# Patient Record
Sex: Female | Born: 1980 | Race: Asian | Hispanic: No | Marital: Married | Smoking: Never smoker
Health system: Southern US, Community
[De-identification: ages and names within clinical notes are randomized; demographics above are authoritative.]

## PROBLEM LIST (undated history)

## (undated) DIAGNOSIS — N200 Calculus of kidney: Secondary | ICD-10-CM

## (undated) DIAGNOSIS — K269 Duodenal ulcer, unspecified as acute or chronic, without hemorrhage or perforation: Secondary | ICD-10-CM

## (undated) DIAGNOSIS — K219 Gastro-esophageal reflux disease without esophagitis: Secondary | ICD-10-CM

## (undated) HISTORY — DX: Duodenal ulcer, unspecified as acute or chronic, without hemorrhage or perforation: K26.9

## (undated) HISTORY — DX: Gastro-esophageal reflux disease without esophagitis: K21.9

## (undated) HISTORY — DX: Calculus of kidney: N20.0

---

## 2000-04-19 HISTORY — PX: OTHER SURGICAL HISTORY: SHX169

## 2006-06-18 DIAGNOSIS — K269 Duodenal ulcer, unspecified as acute or chronic, without hemorrhage or perforation: Secondary | ICD-10-CM

## 2006-06-18 HISTORY — DX: Duodenal ulcer, unspecified as acute or chronic, without hemorrhage or perforation: K26.9

## 2017-04-19 DIAGNOSIS — N200 Calculus of kidney: Secondary | ICD-10-CM

## 2017-04-19 HISTORY — DX: Calculus of kidney: N20.0

## 2017-05-08 ENCOUNTER — Other Ambulatory Visit: Payer: Self-pay

## 2017-05-08 ENCOUNTER — Emergency Department (HOSPITAL_COMMUNITY)
Admission: EM | Admit: 2017-05-08 | Discharge: 2017-05-09 | Disposition: A | Discharge status: Home / Self Care | Payer: Managed Care, Other (non HMO) | Attending: Emergency Medicine | Admitting: Emergency Medicine

## 2017-05-08 DIAGNOSIS — E876 Hypokalemia: Secondary | ICD-10-CM | POA: Insufficient documentation

## 2017-05-08 DIAGNOSIS — N134 Hydroureter: Secondary | ICD-10-CM | POA: Insufficient documentation

## 2017-05-08 DIAGNOSIS — N200 Calculus of kidney: Secondary | ICD-10-CM

## 2017-05-08 DIAGNOSIS — N201 Calculus of ureter: Secondary | ICD-10-CM | POA: Diagnosis not present

## 2017-05-08 DIAGNOSIS — N130 Hydronephrosis with ureteropelvic junction obstruction: Secondary | ICD-10-CM | POA: Diagnosis not present

## 2017-05-08 DIAGNOSIS — R112 Nausea with vomiting, unspecified: Secondary | ICD-10-CM | POA: Diagnosis not present

## 2017-05-08 DIAGNOSIS — R1032 Left lower quadrant pain: Secondary | ICD-10-CM | POA: Diagnosis present

## 2017-05-08 LAB — COMPREHENSIVE METABOLIC PANEL
ALBUMIN: 4.3 g/dL (ref 3.5–5.0)
ALT: 17 U/L (ref 14–54)
ANION GAP: 8 (ref 5–15)
AST: 20 U/L (ref 15–41)
Alkaline Phosphatase: 63 U/L (ref 38–126)
BILIRUBIN TOTAL: 0.5 mg/dL (ref 0.3–1.2)
BUN: 13 mg/dL (ref 6–20)
CO2: 26 mmol/L (ref 22–32)
Calcium: 9 mg/dL (ref 8.9–10.3)
Chloride: 101 mmol/L (ref 101–111)
Creatinine, Ser: 0.67 mg/dL (ref 0.44–1.00)
GFR calc Af Amer: >60 mL/min (ref >60)
GFR calc non Af Amer: >60 mL/min (ref >60)
GLUCOSE: 166 mg/dL — AB (ref 65–99)
POTASSIUM: 3 mmol/L — AB (ref 3.5–5.1)
Sodium: 135 mmol/L (ref 135–145)
TOTAL PROTEIN: 7.3 g/dL (ref 6.5–8.1)

## 2017-05-08 LAB — CBC WITH DIFFERENTIAL/PLATELET
BASOS ABS: 0 10*3/uL (ref 0.0–0.1)
Basophils Relative: 0 %
Eosinophils Absolute: 0.1 10*3/uL (ref 0.0–0.7)
Eosinophils Relative: 1 %
HEMATOCRIT: 39.9 % (ref 36.0–46.0)
Hemoglobin: 13.4 g/dL (ref 12.0–15.0)
LYMPHS PCT: 9 %
Lymphs Abs: 1.3 10*3/uL (ref 0.7–4.0)
MCH: 29.7 pg (ref 26.0–34.0)
MCHC: 33.6 g/dL (ref 30.0–36.0)
MCV: 88.5 fL (ref 78.0–100.0)
MONO ABS: 0.8 10*3/uL (ref 0.1–1.0)
Monocytes Relative: 5 %
Neutro Abs: 13.1 10*3/uL — ABNORMAL HIGH (ref 1.7–7.7)
Neutrophils Relative %: 85 %
Platelets: 206 10*3/uL (ref 150–400)
RBC: 4.51 MIL/uL (ref 3.87–5.11)
RDW: 11.6 % (ref 11.5–15.5)
WBC: 15.3 10*3/uL — AB (ref 4.0–10.5)

## 2017-05-08 LAB — LIPASE, BLOOD: Lipase: 25 U/L (ref 11–51)

## 2017-05-08 LAB — I-STAT BETA HCG BLOOD, ED (MC, WL, AP ONLY): I-stat hCG, quantitative: <5 m[IU]/mL (ref <5)

## 2017-05-08 MED ORDER — ONDANSETRON HCL 4 MG/2ML IJ SOLN
4.0000 mg | Freq: Once | INTRAMUSCULAR | Status: AC
  Administered 2017-05-09: 4 mg via INTRAVENOUS
  Filled 2017-05-08: qty 2

## 2017-05-08 MED ORDER — FENTANYL CITRATE (PF) 100 MCG/2ML IJ SOLN
50.0000 ug | Freq: Once | INTRAMUSCULAR | Status: AC
  Administered 2017-05-09: 50 ug via INTRAVENOUS
  Filled 2017-05-08: qty 2

## 2017-05-08 MED ORDER — SODIUM CHLORIDE 0.9 % IV BOLUS (SEPSIS)
1000.0000 mL | Freq: Once | INTRAVENOUS | Status: AC
  Administered 2017-05-09: 1000 mL via INTRAVENOUS

## 2017-05-08 NOTE — ED Notes (Signed)
Bed: [id number] Expected date:  Expected time:  Means of arrival:  Comments: 37 yo F/vomiting-abd pain

## 2017-05-08 NOTE — ED Triage Notes (Addendum)
Pt arriving from home with abdominal pain present since yesterday. Pt received 4mg  Zofran IV from EMS prior to arrival. Pt has no medical history. Pt reports last meal was yesterday. No other family members presenting with same symptoms.

## 2017-05-08 NOTE — ED Provider Notes (Signed)
[facility]-EMERGENCY DEPT Provider Note   CSN: [id number] Arrival date & time: 05/08/17  2306     History   Chief Complaint Chief Complaint  Patient presents with  . Abdominal Pain    HPI Jamie Ingram is a 37 y.o. female with no past medical history presenting via EMS with progressive onset nonradiating pressure pain to the left lower quadrant over the last 4 hours.  She also points to her left flank with discomfort. She has been experiencing associated nausea and vomited twice since onset.  She also reports that she had a similar episode yesterday but it improved after a couple hours.  She has been taking Congohinese remedy for intestinal symptoms with mild relief. Last bowel movement was at 8 PM tonight and normal.  She is still passing gas.  She denies fever, chills, dysuria, vaginal discharge or bleeding. LMP was 04/28/16, G1P1001 Husband is at bedside helping translating and providing history.  HPI  History reviewed. No pertinent past medical history.  There are no active problems to display for this patient.   OB History    No data available       Home Medications    Prior to Admission medications   Medication Sig Start Date End Date Taking? Authorizing Provider  fexofenadine (ALLEGRA) 180 MG tablet Take 180 mg by mouth daily.   Yes [provider]  ondansetron (ZOFRAN ODT) 4 MG disintegrating tablet Take 1 tablet (4 mg total) by mouth every 8 (eight) hours as needed for nausea or vomiting. 05/09/17   Mathews RobinsonsMitchell, Jessica B, PA-C  oxyCODONE-acetaminophen (PERCOCET/ROXICET) 5-325 MG tablet Take 2 tablets by mouth every 4 (four) hours as needed for severe pain. 05/09/17   Georgiana ShoreMitchell, Jessica B, PA-C    Family History No family history on file.  Social History Social History   Tobacco Use  . Smoking status: Not on file  Substance Use Topics  . Alcohol use: Not on file  . Drug use: Not on file     Allergies   Patient has no known  allergies.   Review of Systems Review of Systems  Constitutional: Negative for chills, diaphoresis and fever.  HENT: Negative for sore throat.   Respiratory: Negative for cough, shortness of breath, wheezing and stridor.   Cardiovascular: Negative for chest pain and palpitations.  Gastrointestinal: Positive for abdominal pain, nausea and vomiting. Negative for abdominal distention, blood in stool and diarrhea.  Genitourinary: Positive for flank pain. Negative for difficulty urinating, dysuria, frequency, hematuria, pelvic pain, urgency, vaginal bleeding and vaginal discharge.  Musculoskeletal: Negative for arthralgias, back pain, myalgias, neck pain and neck stiffness.  Skin: Negative for color change, pallor and rash.  Neurological: Negative for dizziness, light-headedness and headaches.     Physical Exam Updated Vital Signs BP 104/68 (BP Location: Right Arm)   Pulse 74   Temp 98.4 F (36.9 C) (Oral)   Resp 15   Ht 5\' 3"  (1.6 m)   Wt 55.8 kg (123 lb)   LMP  (LMP Unknown) Comment: negative beta HCG 05/08/17  SpO2 98%   BMI 21.79 kg/m   Physical Exam  Constitutional: She appears well-developed and well-nourished.  Non-toxic appearance. She does not appear ill. No distress.  Afebrile, nontoxic-appearing, lying in bed no apparent discomfort.  No acute distress.  HENT:  Head: Normocephalic and atraumatic.  Eyes: Conjunctivae are normal. No scleral icterus.  Neck: Neck supple.  Cardiovascular: Normal rate, regular rhythm and normal heart sounds.  No murmur heard. Pulmonary/Chest: Effort  normal and breath sounds normal. No stridor. No respiratory distress. She has no wheezes. She has no rhonchi. She has no rales.  Abdominal: Soft. Normal appearance and bowel sounds are normal. She exhibits no distension. There is tenderness in the left lower quadrant. There is no rigidity, no rebound, no guarding, no CVA tenderness and no tenderness at McBurney's point.  Musculoskeletal: She  exhibits no edema.  Neurological: She is alert.  Skin: Skin is warm and dry. No rash noted. She is not diaphoretic. No cyanosis or erythema. No pallor.  Psychiatric: She has a normal mood and affect.  Nursing note and vitals reviewed.    ED Treatments / Results  Labs (all labs ordered are listed, but only abnormal results are displayed) Labs Reviewed  COMPREHENSIVE METABOLIC PANEL - Abnormal; Notable for the following components:      Result Value   Potassium 3.0 (*)    Glucose, Bld 166 (*)    All other components within normal limits  CBC WITH DIFFERENTIAL/PLATELET - Abnormal; Notable for the following components:   WBC 15.3 (*)    Neutro Abs 13.1 (*)    All other components within normal limits  URINALYSIS, ROUTINE W REFLEX MICROSCOPIC - Abnormal; Notable for the following components:   APPearance CLOUDY (*)    Hgb urine dipstick SMALL (*)    Ketones, ur 20 (*)    Leukocytes, UA MODERATE (*)    Bacteria, UA RARE (*)    Squamous Epithelial / LPF 0-5 (*)    All other components within normal limits  LIPASE, BLOOD  I-STAT BETA HCG BLOOD, ED ([facility], [facility], [facility] ONLY)    EKG  EKG Interpretation None       Radiology Ct Abdomen Pelvis W Contrast  Result Date: 05/09/2017 CLINICAL DATA:  Abdominal pain and left flank pain. EXAM: CT ABDOMEN AND PELVIS WITH CONTRAST TECHNIQUE: Multidetector CT imaging of the abdomen and pelvis was performed using the standard protocol following bolus administration of intravenous contrast. CONTRAST:  80 mL ISOVUE-300 IOPAMIDOL (ISOVUE-300) INJECTION 61% COMPARISON:  None. FINDINGS: Lower chest: No pulmonary nodules or pleural effusion. No visible pericardial effusion. Hepatobiliary: Normal hepatic contours and density. No visible biliary dilatation. Normal gallbladder. Pancreas: Normal contours without ductal dilatation. No peripancreatic fluid collection. Spleen: Normal. Adrenals/Urinary Tract: --Adrenal glands: Normal. --Right kidney/ureter: No  hydronephrosis or perinephric stranding. No nephrolithiasis. No obstructing ureteral stones. --Left kidney/ureter: There is a 3 x 3 millimeter stone at the left ureterovesical junction causing mild left hydronephrosis and moderate hydroureter with moderate perinephric stranding. No other left nephrolithiasis. --Urinary bladder: Unremarkable. Stomach/Bowel: --Stomach/Duodenum: No hiatal hernia or other gastric abnormality. Normal duodenal course and caliber. --Small bowel: No dilatation or inflammation. --Colon: No focal abnormality. --Appendix: Normal. Vascular/Lymphatic: Normal course and caliber of the major abdominal vessels. No abdominal or pelvic lymphadenopathy. Reproductive: Normal uterus and ovaries. Musculoskeletal. No bony spinal canal stenosis or focal osseous abnormality. Other: None. IMPRESSION: 1. Left-sided obstructive uropathy with 3 x 3 millimeter ureterovesical junction stone causing moderate hydroureter and mild hydronephrosis with moderate perinephric stranding. 2. No other nephrolithiasis. 3. No diverticulosis or diverticulitis. Electronically Signed   By: Deatra Robinson M.D.   On: 05/09/2017 03:12    Procedures Procedures (including critical care time)  Medications Ordered in ED Medications  sodium chloride 0.9 % bolus 1,000 mL (0 mLs Intravenous Stopped 05/09/17 0110)  fentaNYL (SUBLIMAZE) injection 50 mcg (50 mcg Intravenous Given 05/09/17 0001)  ondansetron (ZOFRAN) injection 4 mg (4 mg Intravenous Given 05/09/17 0001)  iopamidol (  ISOVUE-300) 61 % injection 30 mL (30 mLs Oral Contrast Given 05/09/17 0028)  potassium chloride SA (K-DUR,KLOR-CON) CR tablet 40 mEq (40 mEq Oral Given 05/09/17 0213)  fentaNYL (SUBLIMAZE) injection 50 mcg (50 mcg Intravenous Given 05/09/17 0244)  iopamidol (ISOVUE-300) 61 % injection 100 mL (80 mLs Intravenous Contrast Given 05/09/17 0247)  ketorolac (TORADOL) 15 MG/ML injection 15 mg (15 mg Intravenous Given 05/09/17 0435)  ondansetron (ZOFRAN-ODT)  disintegrating tablet 4 mg (4 mg Oral Given 05/09/17 0434)     Initial Impression / Assessment and Plan / ED Course  I have reviewed the triage vital signs and the nursing notes.  Pertinent labs & imaging results that were available during my care of the patient were reviewed by me and considered in my medical decision making (see chart for details).     Patient presenting with left lower quadrant pain and left flank pain reproducible on palpation. We will give IV fluids, analgesia and reassess Labs with hematuria, leukocytosis and hypokalemia Patient was given potassium  CT with evidence of left sided nephrolithiasis 3 mm at UVJ, hydroureter, mild hydronephrosis with perinephritic stranding.  On reassessment, patient reported significant improvement in her pain and started to feel more nauseated.Patient was given Zofran and successful p.o. Challenge.  She is afebrile, nontoxic-appearing, without CVA tenderness.  Normal vital signs and stable.  Patient improved while in the emergency department.  Discharge home with symptomatic relief and close follow-up with urology.  Discussed strict return precautions and advised to return to the emergency department if experiencing any new or worsening symptoms. Instructions were understood and patient agreed with discharge plan.  Final Clinical Impressions(s) / ED Diagnoses   Final diagnoses:  Nephrolithiasis  Hypokalemia    ED Discharge Orders        Ordered    oxyCODONE-acetaminophen (PERCOCET/ROXICET) 5-325 MG tablet  Every 4 hours PRN     05/09/17 0343    ondansetron (ZOFRAN ODT) 4 MG disintegrating tablet  Every 8 hours PRN     05/09/17 0343       Georgiana Shore, PA-C 05/09/17 0834    Georgiana Shore, PA-C 05/09/17 1610    Shaune Pollack, MD 05/09/17 [phone]

## 2017-05-09 ENCOUNTER — Encounter (HOSPITAL_COMMUNITY): Payer: Self-pay

## 2017-05-09 ENCOUNTER — Emergency Department (HOSPITAL_COMMUNITY): Payer: Managed Care, Other (non HMO)

## 2017-05-09 LAB — URINALYSIS, ROUTINE W REFLEX MICROSCOPIC
BILIRUBIN URINE: NEGATIVE
Glucose, UA: NEGATIVE mg/dL
Ketones, ur: 20 mg/dL — AB
Nitrite: NEGATIVE
PROTEIN: NEGATIVE mg/dL
SPECIFIC GRAVITY, URINE: 1.012 (ref 1.005–1.030)
pH: 8 (ref 5.0–8.0)

## 2017-05-09 MED ORDER — KETOROLAC TROMETHAMINE 15 MG/ML IJ SOLN
15.0000 mg | Freq: Once | INTRAMUSCULAR | Status: AC
  Administered 2017-05-09: 15 mg via INTRAVENOUS
  Filled 2017-05-09: qty 1

## 2017-05-09 MED ORDER — OXYCODONE-ACETAMINOPHEN 5-325 MG PO TABS
2.0000 | ORAL_TABLET | ORAL | 0 refills | Status: DC | PRN
Start: 2017-05-09 — End: 2017-06-14

## 2017-05-09 MED ORDER — POTASSIUM CHLORIDE CRYS ER 20 MEQ PO TBCR
40.0000 meq | EXTENDED_RELEASE_TABLET | Freq: Once | ORAL | Status: AC
  Administered 2017-05-09: 40 meq via ORAL
  Filled 2017-05-09: qty 2

## 2017-05-09 MED ORDER — ONDANSETRON 4 MG PO TBDP
4.0000 mg | ORAL_TABLET | Freq: Once | ORAL | Status: AC
  Administered 2017-05-09: 4 mg via ORAL
  Filled 2017-05-09: qty 1

## 2017-05-09 MED ORDER — IOPAMIDOL (ISOVUE-300) INJECTION 61%
INTRAVENOUS | Status: AC
  Administered 2017-05-09: 80 mL via INTRAVENOUS
  Filled 2017-05-09: qty 100

## 2017-05-09 MED ORDER — FENTANYL CITRATE (PF) 100 MCG/2ML IJ SOLN
50.0000 ug | Freq: Once | INTRAMUSCULAR | Status: AC
  Administered 2017-05-09: 50 ug via INTRAVENOUS
  Filled 2017-05-09: qty 2

## 2017-05-09 MED ORDER — IOPAMIDOL (ISOVUE-300) INJECTION 61%
30.0000 mL | Freq: Once | INTRAVENOUS | Status: AC | PRN
  Administered 2017-05-09: 30 mL via ORAL

## 2017-05-09 MED ORDER — IOPAMIDOL (ISOVUE-300) INJECTION 61%
100.0000 mL | Freq: Once | INTRAVENOUS | Status: AC | PRN
  Administered 2017-05-09: 80 mL via INTRAVENOUS

## 2017-05-09 MED ORDER — IOPAMIDOL (ISOVUE-300) INJECTION 61%
INTRAVENOUS | Status: AC
Start: 2017-05-09 — End: 2017-05-09
  Administered 2017-05-09: 30 mL via ORAL
  Filled 2017-05-09: qty 30

## 2017-05-09 MED ORDER — ONDANSETRON 4 MG PO TBDP: 4.0000 mg | ORAL_TABLET | Freq: Three times a day (TID) | ORAL | 0 refills | Status: DC | PRN

## 2017-05-09 NOTE — ED Notes (Signed)
Pt made aware that urine sample is needed.

## 2017-05-09 NOTE — Discharge Instructions (Addendum)
As discussed, make sure that you stay well-hydrated and take pain medicine as needed.  Use the strainer for urine to attempt to catch stone. Follow up with urology by calling the number provided in these instructions.  Return sooner if symptoms worsen, fever, chills, nausea, vomiting or other new concerning symptoms in the meantime.

## 2017-06-14 ENCOUNTER — Telehealth: Payer: Self-pay | Admitting: Physician Assistant

## 2017-06-14 ENCOUNTER — Ambulatory Visit: Payer: Managed Care, Other (non HMO) | Admitting: Physician Assistant

## 2017-06-14 ENCOUNTER — Encounter: Payer: Self-pay | Admitting: Physician Assistant

## 2017-06-14 VITALS — BP 92/60 | HR 77 | Temp 97.9°F | Ht 64.0 in | Wt 121.5 lb

## 2017-06-14 DIAGNOSIS — R519 Headache, unspecified: Secondary | ICD-10-CM

## 2017-06-14 DIAGNOSIS — Z7689 Persons encountering health services in other specified circumstances: Secondary | ICD-10-CM | POA: Diagnosis not present

## 2017-06-14 DIAGNOSIS — R51 Headache: Secondary | ICD-10-CM | POA: Diagnosis not present

## 2017-06-14 DIAGNOSIS — E876 Hypokalemia: Secondary | ICD-10-CM

## 2017-06-14 LAB — BASIC METABOLIC PANEL
BUN: 8 mg/dL (ref 6–23)
CHLORIDE: 103 meq/L (ref 96–112)
CO2: 28 meq/L (ref 19–32)
Calcium: 9.4 mg/dL (ref 8.4–10.5)
Creatinine, Ser: 0.56 mg/dL (ref 0.40–1.20)
GFR: 129.76 mL/min (ref >60.00)
GLUCOSE: 84 mg/dL (ref 70–99)
POTASSIUM: 4.2 meq/L (ref 3.5–5.1)
SODIUM: 137 meq/L (ref 135–145)

## 2017-06-14 NOTE — Patient Instructions (Signed)
It was great to meet you!  We will call you with your lab results.  If you have a headache, please take 500 mg every 4 to 6 hours. If this doesn't help your headache, please let us know.

## 2017-06-14 NOTE — Progress Notes (Signed)
Jamie Ingram is a 37 y.o. female here to Establish Care.  I acted as a Neurosurgeon for [employer], PA-C Corky Mull, LPN  History of Present Illness:   Chief Complaint  Patient presents with  . Establish Care    [employer]    Acute Concerns: Hypokalemia -- patient recently was in the ED on 1/201/19 for kidney stone. She has followed up with urology and stated that they told her everything looks "normal" and that they tested her urine. She is here to establish care and talk about her potassium level as it was 3.0 in January while in the ED. She states that she took a one-time potassium medication in the hospital. Denies cramping in legs. Eats wide variety of foods. Headaches -- she gets occasional headaches. No blurred vision, dizziness, weakness on one side of the body. She doesn't know what she can take for her headaches as she recently moved to the [place] and is trying to conceive. Describes HA as mild. Unable to really describe the frequency or location. Currently asymptomatic.   Depression screen [facility] 2/9 06/14/2017  Decreased Interest 0  Down, Depressed, Hopeless 0  PHQ - 2 Score 0    No flowsheet data found.   Other providers/specialists: [facility] -- requesting records  Past Medical History:  Diagnosis Date  . Duodenal ulcer 06/2006  . Kidney stone 04/2017     Social History   Socioeconomic History  . Marital status: Married    Spouse name: Not on file  . Number of children: Not on file  . Years of education: Not on file  . Highest education level: Not on file  Social Needs  . Financial resource strain: Not on file  . Food insecurity - worry: Not on file  . Food insecurity - inability: Not on file  . Transportation needs - medical: Not on file  . Transportation needs - non-medical: Not on file  Occupational History  . Not on file  Tobacco Use  . Smoking status: Never Smoker  . Smokeless tobacco: Never Used  Substance and Sexual Activity  . Alcohol use:  No    Frequency: Never  . Drug use: No  . Sexual activity: Yes    Birth control/protection: None  Other Topics Concern  . Not on file  Social History Narrative   From [place]. Moved to [place] last August for husband.   1 child.   Does not work.    Past Surgical History:  Procedure Laterality Date  . right inguinal hernia repair Right 2002  . VAGINAL DELIVERY  2013    History reviewed. No pertinent family history.  No Known Allergies   Current Medications:   Current Outpatient Medications:  .  Loratadine (KLS ALLERCLEAR PO), Take 10 mg by mouth daily., Disp: , Rfl:    Review of Systems:   ROS Negative unless otherwise specified per HPI.  Vitals:   Vitals:   06/14/17 0945  BP: 92/60  Pulse: 77  Temp: 97.9 F (36.6 C)  TempSrc: Oral  SpO2: 97%  Weight: 121 lb 8 oz (55.1 kg)  Height: 5\' 4"  (1.626 m)     Body mass index is 20.86 kg/m.  Physical Exam:   Physical Exam  Constitutional: She appears well-developed. She is cooperative.  Non-toxic appearance. She does not have a sickly appearance. She does not appear ill. No distress.  Cardiovascular: Normal rate, regular rhythm, S1 normal, S2 normal, normal heart sounds and normal pulses.  No LE edema  Pulmonary/Chest: Effort  normal and breath sounds normal.  Neurological: She is alert. She has normal strength. No cranial nerve deficit or sensory deficit. GCS eye subscore is 4. GCS verbal subscore is 5. GCS motor subscore is 6.  Skin: Skin is warm, dry and intact.  Psychiatric: She has a normal mood and affect. Her speech is normal and behavior is normal.  Nursing note and vitals reviewed.   Assessment and Plan:    Takeyah was seen today for establish care.  Diagnoses and all orders for this visit:  Hypokalemia Was low in ED in January. Re-check today. Currently not on any supplementation. Currently asymptomatic. Management per lab result. -     Basic metabolic panel  Encounter to establish care Follow-up prn.  Physical to be done after July 2019.  Nonintractable headache, unspecified chronicity pattern, unspecified headache type Currently asymptomatic. Discussed use of Tylenol prn for HA. Follow-up if symptoms worsen or persist despite treatment. She is currently trying to conceive so I did not recommend ibuprofen at this time.   . Reviewed expectations re: course of current medical issues. . Discussed self-management of symptoms. . Outlined signs and symptoms indicating need for more acute intervention. . Patient verbalized understanding and all questions were answered. . See orders for this visit as documented in the electronic medical record. . Patient received an After-Visit Summary.   CMA or LPN served as scribe during this visit. History, Physical, and Plan performed by medical provider. Documentation and orders reviewed and attested to.   Jarold MottoSamantha Sharen Youngren, PA-C

## 2017-06-14 NOTE — Telephone Encounter (Signed)
The patient walked into the office stating she received a call from us but when she tried calling back she couldn't get in touch with anyone. Best phone number is the one on file. She says it okay to leave the results in a voicemail if she doesn't answer.

## 2017-06-15 NOTE — Telephone Encounter (Signed)
See result note.  

## 2017-06-16 ENCOUNTER — Encounter: Payer: Self-pay | Admitting: Physician Assistant

## 2017-06-16 DIAGNOSIS — N201 Calculus of ureter: Secondary | ICD-10-CM | POA: Insufficient documentation

## 2017-06-30 ENCOUNTER — Telehealth: Payer: Self-pay | Admitting: *Deleted

## 2017-06-30 NOTE — Telephone Encounter (Signed)
Left message on voicemail paperwork is ready for pickup.

## 2017-07-01 NOTE — Telephone Encounter (Signed)
Pt picked up paperwork.

## 2018-12-16 ENCOUNTER — Encounter (HOSPITAL_BASED_OUTPATIENT_CLINIC_OR_DEPARTMENT_OTHER): Payer: Self-pay | Admitting: Emergency Medicine

## 2018-12-16 ENCOUNTER — Emergency Department (HOSPITAL_BASED_OUTPATIENT_CLINIC_OR_DEPARTMENT_OTHER): Payer: Managed Care, Other (non HMO)

## 2018-12-16 ENCOUNTER — Emergency Department (HOSPITAL_BASED_OUTPATIENT_CLINIC_OR_DEPARTMENT_OTHER)
Admission: EM | Admit: 2018-12-16 | Discharge: 2018-12-16 | Disposition: A | Discharge status: Home / Self Care | Payer: Managed Care, Other (non HMO) | Attending: Emergency Medicine | Admitting: Emergency Medicine

## 2018-12-16 ENCOUNTER — Other Ambulatory Visit: Payer: Self-pay

## 2018-12-16 DIAGNOSIS — R1013 Epigastric pain: Secondary | ICD-10-CM | POA: Insufficient documentation

## 2018-12-16 LAB — URINALYSIS, MICROSCOPIC (REFLEX)

## 2018-12-16 LAB — COMPREHENSIVE METABOLIC PANEL
ALT: 12 U/L (ref 0–44)
AST: 14 U/L — ABNORMAL LOW (ref 15–41)
Albumin: 4.5 g/dL (ref 3.5–5.0)
Alkaline Phosphatase: 68 U/L (ref 38–126)
Anion gap: 9 (ref 5–15)
BUN: 7 mg/dL (ref 6–20)
CO2: 25 mmol/L (ref 22–32)
Calcium: 8.9 mg/dL (ref 8.9–10.3)
Chloride: 104 mmol/L (ref 98–111)
Creatinine, Ser: 0.46 mg/dL (ref 0.44–1.00)
GFR calc Af Amer: >60 mL/min (ref >60)
GFR calc non Af Amer: >60 mL/min (ref >60)
Glucose, Bld: 95 mg/dL (ref 70–99)
Potassium: 3.3 mmol/L — ABNORMAL LOW (ref 3.5–5.1)
Sodium: 138 mmol/L (ref 135–145)
Total Bilirubin: 0.7 mg/dL (ref 0.3–1.2)
Total Protein: 7.7 g/dL (ref 6.5–8.1)

## 2018-12-16 LAB — CBC
HCT: 42.4 % (ref 36.0–46.0)
Hemoglobin: 13.7 g/dL (ref 12.0–15.0)
MCH: 29.8 pg (ref 26.0–34.0)
MCHC: 32.3 g/dL (ref 30.0–36.0)
MCV: 92.4 fL (ref 80.0–100.0)
Platelets: 231 10*3/uL (ref 150–400)
RBC: 4.59 MIL/uL (ref 3.87–5.11)
RDW: 11.6 % (ref 11.5–15.5)
WBC: 6.6 10*3/uL (ref 4.0–10.5)
nRBC: 0 % (ref 0.0–0.2)

## 2018-12-16 LAB — URINALYSIS, ROUTINE W REFLEX MICROSCOPIC
Bilirubin Urine: NEGATIVE
Glucose, UA: NEGATIVE mg/dL
Ketones, ur: 15 mg/dL — AB
Nitrite: NEGATIVE
Protein, ur: NEGATIVE mg/dL
Specific Gravity, Urine: <1.005 — ABNORMAL LOW (ref 1.005–1.030)
pH: 6 (ref 5.0–8.0)

## 2018-12-16 LAB — LIPASE, BLOOD: Lipase: 23 U/L (ref 11–51)

## 2018-12-16 LAB — PREGNANCY, URINE: Preg Test, Ur: NEGATIVE

## 2018-12-16 MED ORDER — PANTOPRAZOLE SODIUM 20 MG PO TBEC: 20.0000 mg | DELAYED_RELEASE_TABLET | Freq: Every day | ORAL | 0 refills | Status: DC

## 2018-12-16 MED ORDER — SUCRALFATE 1 GM/10ML PO SUSP: 1.0000 g | Freq: Three times a day (TID) | ORAL | 0 refills | Status: DC

## 2018-12-16 MED ORDER — FAMOTIDINE 20 MG PO TABS: 20.0000 mg | ORAL_TABLET | Freq: Two times a day (BID) | ORAL | 0 refills | Status: DC

## 2018-12-16 MED ORDER — SODIUM CHLORIDE 0.9% FLUSH
3.0000 mL | Freq: Once | INTRAVENOUS | Status: DC
  Filled 2018-12-16: qty 3

## 2018-12-16 MED ORDER — ALUM & MAG HYDROXIDE-SIMETH 200-200-20 MG/5ML PO SUSP: 30.0000 mL | Freq: Once | ORAL | Status: DC

## 2018-12-16 NOTE — Discharge Instructions (Addendum)
You have been diagnosed today with Epigastric pain.  At this time there does not appear to be the presence of an emergent medical condition, however there is always the potential for conditions to change. Please read and follow the below instructions.  Please return to the Emergency Department immediately for any new or worsening symptoms. Please be sure to follow up with your Primary Care Provider within one week regarding your visit today; please call their office to schedule an appointment even if you are feeling better for a follow-up visit. Please follow-up with the specialists at [facility] gastroenterology for further evaluation of your epigastric pain.  Call their number on Monday to schedule follow-up appointment. You may take the medications Pepcid, Carafate and Protonix as prescribed to help with acid buildup and gastrointestinal pain.  Please drink plenty of water and get plenty of rest.  Try to avoid eating within 2 hours of going to bed and avoid laying down after eating as this may worsen pain.  Get help right away if: Your pain does not go away as soon as your doctor says it should. You cannot stop throwing up. Your pain is only in areas of your belly, such as the right side or the left lower part of the belly. You have bloody or black poop, or poop that looks like tar. You have very bad pain, cramping, or bloating in your belly. You have signs of not having enough fluid or water in your body (dehydration), such as: Dark pee, very little pee, or no pee. Cracked lips. Dry mouth. Sunken eyes. Sleepiness. Weakness. Have pain in your arms, neck, jaw, teeth, or back. Feel sweaty, dizzy, or light-headed. Have chest pain or shortness of breath. Vomit and your vomit looks like blood or coffee grounds. Faint. Have stool that is bloody or black. Cannot swallow, drink, or eat. Any new/concerning or worsening symptoms  Please read the additional information packets attached to your  discharge summary.  Do not take your medicine if  develop an itchy rash, swelling in your mouth or lips, or difficulty breathing; call 911 and seek immediate emergency medical attention if this occurs. ==================== Below has been translated using Google translate.  Errors may be present.  Interpret with caution.  ??????????????????????????????????????????????????????? Ika wa g?guru hon'yaku o shiy? shite hon'yaku sa rete imasu. Er? ga hassei shite iru kan?sei ga arimasu. Ch?i shite kaishaku shite kudasai. ==================== ????????????????  ?????????????????????????????????????????????????????????????  1.????????????????????????????????????? 2.???????????1??????????????????????????????????????????????????????????????????????????????????? 3.????????????????[place]???????????????????????????????????????????????????? 4.????????????????????????????????????????????????????????????????????????????????2??????????????????????????????????????????????  ?????????????????????? ??????????????????????????????????? ????????????? ??????????????????????????????? ??????????????????????????????????????????? ????????????????????????????? ????????????????????????????????????? o???????????????????????????? o???????? o??? o?????? o??? o??? ????????????????????? ??????????????????????????? ????????????? ?????????????????????????????? ?????? ?????????????????? ???????????????????????????? ????/???????????  ???????????????????????????????  ????????????????????????????????????????????????????????911??????????????????????? [place]?, shinkabuts? to [employer]. Genjitende wa, kinky? no by?j? no sonzai wa mi raremasenga, tsuneni by?j? ga henka suru kan?sei ga arimasu. Ika no shiji o yonde sore ni shitagatte kudasai. 1. Sh?j? ga atarashiku nattari akka shi tari shita baai wa, tadachini ky?ky?-bu ni modotte kudasai. 2. Ky? no h?mon ni tsuite wa, 1-sh?kan inai ni puraimarikeapurobaid? ni  kanarazu for?appu shite kudasai. For?appu h?mon no kibun ga yoku natta to shite mo, yoyaku o suru tame ni karera no ofisu ni denwa shite kudasai. 3. Shinkabuts? no saranaru hy?ka no tame ni, [place] ich?-ka no senmon'i ni for?appu shite kudasai. Getsuy?bi ni tenwabang? ni denwa shite, for?appu no yotei o tatemasu. 4. Shoh? sa reta shoh?-yaku no pepushido, karafate, purotonikusu o fukuy? shite, san no chikuseki to [place]? no itami o yawarageru koto ga dekimasu. Takusan mizu o nonde, [place]. Sh?shin-go 2-jikan inai no shokuji wa sake, shokuji-go ni  yoko ni naru koto wa itami o [place] sa seru kan?sei ga aru tame sakete kudasai. Tsugi no baai wa, sugu ni sap?to o [place]. ? Anata no isha ga s? subekida to itte mo sugu ni anata no itami wa kiemasen. ? Nageru no o yame rarenai. ? Itami wa, hara no migigawa matawa hidarigawa kabu nado no hara no ry?iki ni nomi arimasu. ? Anata wa chimamire no matawa kuroi [place], matawa t?ru no y?nimieru unchi o motte imasu. ? Hij? ni hidoi itami, keiren, matawa fukubub?man-kan ga arimasu. ? Tsugi no y?na,-tai ni j?bun'na suibun matawa mizu ga nai j?tai (dassui-sh?) no ch?k? ga arimasu. O kurai oshikko, hij? ni ch?sana oshikko, [employer]. O hibi no haitta kuchibiru. O k?katsu. O kubonda me. O nemuke. O jakuten. ? Karsten Ro, kubi, ago, ha, Doctor, general practice senaka ni itami ga aru. ? Ase o kai tari, memai ga shi tari, memai ga shi tari shimasu. ? Mune no itami ya ikigire ga aru. ? ?to-mono to anata no ?to-mono wa chi ka k?h? kasu no y? ni miemasu. ? Candie Chroman. ? [place] matawa kuroi ben o motte iru. ? Nomikon dari, non dari, tabe tari suru koto wa dekimasen. ? Atarash?/ kenen matawa akka suru sh?j? taiin samar? ni tenpu sa rete iru tsuika j?h? paketto o o yomi kudasai. Kayumi o tomonau hosshin, kuchi ya kuchibiru no [place], matawa [place]? [place] o okoshita baai wa, kusuri o fukuy? shinaide kudasai. [place] ga hassei shita baai wa, 911 ni denwa shi, kinky? no kinky? iry? shochi o  motomete kudasai.

## 2018-12-16 NOTE — ED Provider Notes (Addendum)
[facility] EMERGENCY DEPARTMENT Provider Note   CSN: [id number] Arrival date & time: 12/16/18  1204     History   Chief Complaint Chief Complaint  Patient presents with  . Abdominal Pain    HPI   Jamie MayotteJapanese translator used throughout this visit. Jamie [place] is a 38 y.o. female with history of duodenal ulcers and kidney stones presents today for upper abdominal pain that has been present for 2 days.  Patient reports intermittent pain of the upper abdomen a burning sensation that is moderate in intensity.  Patient reports that pain lasts several minutes at a time.  She reports that pain is present whenever she lies flat on her back and then improves when she lays on her left side or stands up.  She reports pain last night caused her difficulty sleeping.  She has not attempted any medications for her pain.  She denies radiation of pain.  Patient denies any pain at this time, reports she had some earlier this morning which resolved.  Patient denies fever/chills, nausea/vomiting, diarrhea, chest pain, shortness of breath, cough, headache, fall/injury, extremity pain/swelling, numbness/weakness, tingling, vaginal discharge, dysuria/hematuria, concern for sexually transmitted disease.  Patient reports that she started her menstrual cycle 3 days ago which was consistent with her regular schedule.  She denies any other concerns today.    HPI  Past Medical History:  Diagnosis Date  . Duodenal ulcer 06/2006  . Kidney stone 04/2017    Patient Active Problem List   Diagnosis Date Noted  . Left ureteral stone 06/16/2017    Past Surgical History:  Procedure Laterality Date  . right inguinal hernia repair Right 2002  . VAGINAL DELIVERY  2013     OB History   No obstetric history on file.      Home Medications    Prior to Admission medications   Medication Sig Start Date End Date Taking? Authorizing Provider  famotidine (PEPCID) 20 MG tablet Take 1 tablet (20 mg  total) by mouth 2 (two) times daily. 12/16/18   Harlene SaltsMorelli, Avaneesh Pepitone A, PA-C  Loratadine (KLS ALLERCLEAR PO) Take 10 mg by mouth daily.    [provider]  pantoprazole (PROTONIX) 20 MG tablet Take 1 tablet (20 mg total) by mouth daily. 12/16/18   Harlene SaltsMorelli, Michayla Mcneil A, PA-C  sucralfate (CARAFATE) 1 GM/10ML suspension Take 10 mLs (1 g total) by mouth 4 (four) times daily -  with meals and at bedtime. 12/16/18   Bill SalinasMorelli, Lleyton Byers A, PA-C    Family History No family history on file.  Social History Social History   Tobacco Use  . Smoking status: Never Smoker  . Smokeless tobacco: Never Used  Substance Use Topics  . Alcohol use: No    Frequency: Never  . Drug use: No     Allergies   Patient has no known allergies.   Review of Systems Review of Systems Ten systems are reviewed and are negative for acute change except as noted in the HPI  Physical Exam Updated Vital Signs BP (!) 128/99 (BP Location: Right Arm)   Pulse 76   Temp 98.5 F (36.9 C) (Oral)   Resp 16   Ht 5' 4.17" (1.63 m)   Wt 43 kg   LMP 12/13/2018   SpO2 100%   BMI 16.18 kg/m   Physical Exam Constitutional:      General: She is not in acute distress.    Appearance: Normal appearance. She is well-developed. She is not ill-appearing or diaphoretic.  HENT:  Head: Normocephalic and atraumatic.     Right Ear: External ear normal.     Left Ear: External ear normal.     Nose: Nose normal.  Eyes:     General: Vision grossly intact. Gaze aligned appropriately.     Pupils: Pupils are equal, round, and reactive to light.  Neck:     Musculoskeletal: Normal range of motion.     Trachea: Trachea and phonation normal. No tracheal deviation.  Cardiovascular:     Rate and Rhythm: Normal rate and regular rhythm.     Pulses:          Dorsalis pedis pulses are 2+ on the right side and 2+ on the left side.     Heart sounds: Normal heart sounds.  Pulmonary:     Effort: Pulmonary effort is normal. No respiratory  distress.  Abdominal:     General: There is no distension.     Palpations: Abdomen is soft.     Tenderness: There is no abdominal tenderness. There is no right CVA tenderness, left CVA tenderness, guarding or rebound. Negative signs include Murphy's sign and McBurney's sign.  Genitourinary:    Comments: Examination deferred by patient. Musculoskeletal: Normal range of motion.     Right lower leg: Normal.     Left lower leg: Normal.  Feet:     Right foot:     Protective Sensation: 3 sites tested. 3 sites sensed.     Left foot:     Protective Sensation: 3 sites tested. 3 sites sensed.  Skin:    General: Skin is warm and dry.  Neurological:     Mental Status: She is alert.     GCS: GCS eye subscore is 4. GCS verbal subscore is 5. GCS motor subscore is 6.     Comments: Speech is clear and goal oriented, follows commands Major Cranial nerves without deficit, no facial droop Moves extremities without ataxia, coordination intact  Psychiatric:        Behavior: Behavior normal.      ED Treatments / Results  Labs (all labs ordered are listed, but only abnormal results are displayed) Labs Reviewed  COMPREHENSIVE METABOLIC PANEL - Abnormal; Notable for the following components:      Result Value   Potassium 3.3 (*)    AST 14 (*)    All other components within normal limits  URINALYSIS, ROUTINE W REFLEX MICROSCOPIC - Abnormal; Notable for the following components:   Color, Urine STRAW (*)    Specific Gravity, Urine <1.005 (*)    Hgb urine dipstick MODERATE (*)    Ketones, ur 15 (*)    Leukocytes,Ua SMALL (*)    All other components within normal limits  URINALYSIS, MICROSCOPIC (REFLEX) - Abnormal; Notable for the following components:   Bacteria, UA RARE (*)    All other components within normal limits  LIPASE, BLOOD  CBC  PREGNANCY, URINE    EKG EKG Interpretation  Date/Time:  Saturday December 16 2018 12:29:17 EDT Ventricular Rate:  62 PR Interval:    QRS Duration: 86  QT Interval:  417 QTC Calculation: 424 R Axis:   86 Text Interpretation:  Sinus rhythm Baseline wander in lead(s) II aVF V4 V5 Normal ECG Confirmed by Gwyneth Sprout ([id number]) on 12/16/2018 4:32:53 PM   Radiology US Abdomen Complete  Result Date: 12/16/2018 CLINICAL DATA:  Epigastric burning pain. EXAM: ABDOMEN ULTRASOUND COMPLETE COMPARISON:  CT of the abdomen on 05/09/2017 FINDINGS: Gallbladder: No gallstones or wall thickening visualized. No sonographic Eulah Pont  sign noted by sonographer. Common bile duct: Diameter: 2 mm. Liver: No focal lesion identified. Within normal limits in parenchymal echogenicity. Portal vein is patent on color Doppler imaging with normal direction of blood flow towards the liver. IVC: No abnormality visualized. Pancreas: Visualized portion unremarkable. Spleen: Size and appearance within normal limits. Right Kidney: Length: 10 cm. Echogenicity within normal limits. No mass or hydronephrosis visualized. Left Kidney: Length: 9.6 cm. Echogenicity within normal limits. No mass or hydronephrosis visualized. Abdominal aorta: No aneurysm visualized. Other findings: No ascites. IMPRESSION: Normal abdominal ultrasound. Electronically Signed   By: Irish LackGlenn  Yamagata M.D.   On: 12/16/2018 15:01    Procedures Procedures (including critical care time)  Medications Ordered in ED Medications - No data to display   Initial Impression / Assessment and Plan / ED Course  I have reviewed the triage vital signs and the nursing notes.  Pertinent labs & imaging results that were available during my care of the patient were reviewed by me and considered in my medical decision making (see chart for details).     On initial evaluation patient is overall well-appearing and in no acute distress.  She denies any pain currently.  She does not want any medications in the ED as she is pain-free.  History suspicious for GERD.  Physical examination today reassuring patient with lungs clear to  auscultation bilaterally, heart regular rate and rhythm, abdomen soft nontender without peritoneal signs, neurovascular intact to all 4 extremities without sign of DVT.  Vital signs stable. - Urinalysis with hemoglobin present, 15 ketones, small leukocytes, nitrite negative.  Patient currently on menstrual cycle and is without urinary symptoms do not suspect UTI.  Lipase within normal limits Pregnancy test negative CMP nonacute CBC within normal limits Abdominal ultrasound:  IMPRESSION:  Normal abdominal ultrasound.   EKG: Sinus rhythm Baseline wander in lead(s) II aVF V4 V5 Normal ECG Confirmed by Gwyneth SproutPlunkett, Whitney ([id number]) on 12/16/2018 4:32:53 PM - Patient refused pelvic examination today stating that she has no pelvic symptoms or concern for STD, but this is reasonable at this time she will follow-up with OB/GYN for any pelvic related symptoms. ----- On reassessment patient is overall well-appearing in no acute distress, still denying any pain.  Suspect patient with reflux as etiology of her symptoms today.  Do not suspect perforation, obstruction, infection or other acute abdominal pelvic etiology of patient's symptoms at this time based on history, physical examination, work-up and vital signs.  Will treat patient symptomatically with Pepcid, Protonix, Carafate and GI referral.  EKG was obtained by triage and is reassuring, do not suspect cardiopulmonary etiology of patient's symptoms based on history and work-up.  At this time there does not appear to be any evidence of an acute emergency medical condition and the patient appears stable for discharge with appropriate outpatient follow up. Diagnosis was discussed with patient who verbalizes understanding of care plan and is agreeable to discharge. I have discussed return precautions with patient who verbalizes understanding of return precautions. Patient encouraged to follow-up with their PCP and gastroenterology. All questions answered.   Patient's case discussed with Dr. Anitra LauthPlunkett who agrees with plan to discharge with gastroenterology follow-up.   Jamie MayotteJapanese translator used throughout this visit.  Note: Portions of this report may have been transcribed using voice recognition software. Every effort was made to ensure accuracy; however, inadvertent computerized transcription errors may still be present. Final Clinical Impressions(s) / ED Diagnoses   Final diagnoses:  Epigastric pain    ED Discharge Orders  Ordered    pantoprazole (PROTONIX) 20 MG tablet  Daily     12/16/18 1718    famotidine (PEPCID) 20 MG tablet  2 times daily     12/16/18 1718    sucralfate (CARAFATE) 1 GM/10ML suspension  3 times daily with meals & bedtime     12/16/18 1718           Deliah Boston, PA-C 12/16/18 1730    Deliah Boston, PA-C 12/16/18 1732    Blanchie Dessert, MD 12/16/18 2328

## 2018-12-16 NOTE — ED Notes (Signed)
ED Provider at bedside. 

## 2018-12-16 NOTE — ED Triage Notes (Addendum)
Upper abd pain x 2 days with N/V. Hurts worse lying supine.

## 2018-12-18 ENCOUNTER — Encounter: Payer: Self-pay | Admitting: Gastroenterology

## 2018-12-28 ENCOUNTER — Encounter: Payer: Self-pay | Admitting: Gastroenterology

## 2018-12-28 ENCOUNTER — Other Ambulatory Visit: Payer: Managed Care, Other (non HMO)

## 2018-12-28 ENCOUNTER — Other Ambulatory Visit: Payer: Self-pay

## 2018-12-28 ENCOUNTER — Telehealth: Payer: Self-pay | Admitting: Gastroenterology

## 2018-12-28 ENCOUNTER — Telehealth: Payer: Self-pay | Admitting: General Surgery

## 2018-12-28 ENCOUNTER — Ambulatory Visit: Payer: Managed Care, Other (non HMO) | Admitting: Gastroenterology

## 2018-12-28 VITALS — BP 100/72 | HR 82 | Temp 98.3°F | Ht 64.17 in | Wt 120.0 lb

## 2018-12-28 DIAGNOSIS — R1013 Epigastric pain: Secondary | ICD-10-CM | POA: Diagnosis not present

## 2018-12-28 DIAGNOSIS — Z8719 Personal history of other diseases of the digestive system: Secondary | ICD-10-CM | POA: Diagnosis not present

## 2018-12-28 MED ORDER — PANTOPRAZOLE SODIUM 40 MG PO TBEC: 40.0000 mg | DELAYED_RELEASE_TABLET | Freq: Every day | ORAL | 11 refills | Status: AC

## 2018-12-28 NOTE — Telephone Encounter (Signed)
Contacted the patient and explained that she needed to stop her PPI today and remain off of it for 2 weeks then take her H Pylori stool test. The patient verbalized understanding and repeated back instructions.

## 2018-12-28 NOTE — Progress Notes (Signed)
12/28/2018 Jamie Ingram [id number] [birth year]   HISTORY OF PRESENT ILLNESS: This is a 38 year old female who is new to our office.  She is here today as an ER follow-up of epigastric abdominal pain.  She tells me that when she was 38 years old in [place] she was diagnosed with a duodenal ulcer and H. pylori.  She was treated for the H. pylori.  Recently about 2 months ago had an episode of epigastric abdominal pain.  That resolved and by the next day the pain was gone.  About 2 weeks ago she again developed epigastric abdominal pain.  She went to the emergency room where they did an abdominal ultrasound that was normal.  CBC, CMP, lipase were within normal limits.  She was placed on pantoprazole 20 mg daily, which she has been taking.  She has noticed improvement in her symptoms since that time.  She is also taking Pepcid 20 mg once or twice daily as well.  She also reports occasional sensation that food gets hung up or is slow to go down.  That has just been recent as well.   Past Medical History:  Diagnosis Date  . Duodenal ulcer 06/2006  . Kidney stone 04/2017   Past Surgical History:  Procedure Laterality Date  . right inguinal hernia repair Right 2002  . VAGINAL DELIVERY  2013    reports that she has never smoked. She has never used smokeless tobacco. She reports that she does not drink alcohol or use drugs. family history is not on file. No Known Allergies    Outpatient Encounter Medications as of 12/28/2018  Medication Sig  . famotidine (PEPCID) 20 MG tablet Take 1 tablet (20 mg total) by mouth 2 (two) times daily.  . Loratadine (KLS ALLERCLEAR PO) Take 10 mg by mouth daily.  . pantoprazole (PROTONIX) 20 MG tablet Take 1 tablet (20 mg total) by mouth daily.  . [DISCONTINUED] sucralfate (CARAFATE) 1 GM/10ML suspension Take 10 mLs (1 g total) by mouth 4 (four) times daily -  with meals and at bedtime.   No facility-administered encounter medications on file as of 12/28/2018.      REVIEW OF SYSTEMS  : All other systems reviewed and negative except where noted in the History of Present Illness.   PHYSICAL EXAM: BP 100/72   Pulse 82   Temp 98.3 F (36.8 C)   Ht 5' 4.17" (1.63 m)   Wt 120 lb (54.4 kg)   LMP 12/13/2018   BMI 20.49 kg/m  General: Well developed Asian female in no acute distress Head: Normocephalic and atraumatic Eyes:  Sclerae anicteric, conjunctiva pink. Ears: Normal auditory acuity Lungs: Clear throughout to auscultation; no increased WOB. Heart: Regular rate and rhythm; no increased WOB. Abdomen: Soft, non-distended.  BS present.  Mild epigastric TTP. Musculoskeletal: Symmetrical with no gross deformities  Skin: No lesions on visible extremities Extremities: No edema  Neurological: Alert oriented x 4, grossly non-focal Psychological:  Alert and cooperative. Normal mood and affect  ASSESSMENT AND PLAN: *38 year old [place] female with complaints of epigastric abdominal pain.  Has improved at this point.  Apparently has history of duodenal ulcer and H. pylori that was treated in [place] about 15 years ago.  Is on pantoprazole 20 mg daily.  I have asked her to hold that medication for 14 days and complete H. pylori stool antigen test to confirm that is no longer an issue.  Then she will resume pantoprazole at 40 mg daily.  New prescription sent.  I would then like her to follow-up here in about 6 weeks for reassessment.  If symptoms continue then could consider EGD at that time if needed.   CC:  Jarold MottoWorley, Samantha, [place]

## 2018-12-28 NOTE — Patient Instructions (Addendum)
If you are age 38 or older, your body mass index should be between 23-30. Your Body mass index is 20.49 kg/m. If this is out of the aforementioned range listed, please consider follow up with your Primary Care Provider.  If you are age 68 or younger, your body mass index should be between 19-25. Your Body mass index is 20.49 kg/m. If this is out of the aformentioned range listed, please consider follow up with your Primary Care Provider.   We have sent the following medications to your pharmacy for you to pick up at your convenience:  A new prescription of pantoprazole 40mg  was sent to your pharmacy. Take 1 40 mg tablet daily. DO NOT start this medication until you have completed your stool testing.   Your provider has requested that you go to the basement level for lab work before leaving today. Press "B" on the elevator. The lab is located at the first door on the left as you exit the elevator. You must NOT have had any pantoprazole 2 weeks prior to completing this study.  Thank you for choosing me and [facility]

## 2018-12-29 NOTE — Telephone Encounter (Signed)
Called the patient and she stated she did not call back for instructions, that she remembered speaking with me in regards to stopping her PPI for 2 weeks then she will do her stool sample.

## 2019-01-16 ENCOUNTER — Other Ambulatory Visit: Payer: Managed Care, Other (non HMO)

## 2019-01-16 DIAGNOSIS — R1013 Epigastric pain: Secondary | ICD-10-CM

## 2019-01-16 DIAGNOSIS — Z8719 Personal history of other diseases of the digestive system: Secondary | ICD-10-CM

## 2019-01-17 LAB — HELICOBACTER PYLORI  SPECIAL ANTIGEN
MICRO NUMBER:: 934261
SPECIMEN QUALITY: ADEQUATE

## 2019-01-19 ENCOUNTER — Telehealth: Payer: Self-pay | Admitting: Gastroenterology

## 2019-01-19 NOTE — Telephone Encounter (Signed)
See result note.  

## 2019-02-05 NOTE — Progress Notes (Signed)
Reviewed and agree with documentation and assessment and plan. K. Veena Nandigam , MD   

## 2019-02-07 ENCOUNTER — Other Ambulatory Visit: Payer: Self-pay

## 2019-02-07 ENCOUNTER — Ambulatory Visit: Payer: Managed Care, Other (non HMO) | Admitting: Gastroenterology

## 2019-02-07 ENCOUNTER — Encounter: Payer: Self-pay | Admitting: Gastroenterology

## 2019-02-07 VITALS — BP 106/78 | HR 80 | Temp 98.8°F | Ht 64.0 in | Wt 119.8 lb

## 2019-02-07 DIAGNOSIS — R1013 Epigastric pain: Secondary | ICD-10-CM

## 2019-02-07 NOTE — Progress Notes (Signed)
     02/07/2019 Jamie [place] [id number] 1981-03-18   HISTORY OF PRESENT ILLNESS:  This is a 38 year old female from [place] who is here for follow-up of epigastric abdominal pain and reflux.  Hpylori study here was negative.  Once again, had history of Hpylori and DU on EGD in [place] when she was 44.  Was treated successfully as evidenced by recent negative Hpylori here.  She has been taking her pantoprazole 40 mg daily.  Overall feels better.  She's only had two episodes of epigastric abdominal pain at nighttime since I saw her about 5 weeks ago.  Still concerned, however, because she says that they do EGD's in [place] even when people do not have symptoms.  (I did explain to her that this is probably because of the high incidence of Hpylori and gastric cancer in her country and that her Fransico Setters has been treated and now negative, so her risk is much less).  Nonetheless, she is still concerned.  No other symptoms.   Past Medical History:  Diagnosis Date  . Duodenal ulcer 06/2006  . GERD (gastroesophageal reflux disease)   . Kidney stone 04/2017   Past Surgical History:  Procedure Laterality Date  . right inguinal hernia repair Right 2002  . VAGINAL DELIVERY  2013    reports that she has never smoked. She has never used smokeless tobacco. She reports that she does not drink alcohol or use drugs. family history is not on file. No Known Allergies    Outpatient Encounter Medications as of 02/07/2019  Medication Sig  . pantoprazole (PROTONIX) 40 MG tablet Take 1 tablet (40 mg total) by mouth daily.  . [DISCONTINUED] famotidine (PEPCID) 20 MG tablet Take 1 tablet (20 mg total) by mouth 2 (two) times daily.  . [DISCONTINUED] Loratadine (KLS ALLERCLEAR PO) Take 10 mg by mouth daily.   No facility-administered encounter medications on file as of 02/07/2019.      REVIEW OF SYSTEMS  : All other systems reviewed and negative except where noted in the History of Present Illness.   PHYSICAL  EXAM: BP 106/78   Pulse 80   Temp 98.8 F (37.1 C)   Ht 5\' 4"  (1.626 m)   Wt 119 lb 12.8 oz (54.3 kg)   BMI 20.56 kg/m  General: Well developed [place] female in no acute distress Head: Normocephalic and atraumatic Eyes:  Sclerae anicteric, conjunctiva pink. Ears: Normal auditory acuity Lungs: Clear throughout to auscultation; no increased WOB. Heart: Regular rate and rhythm; no M/R/G. Abdomen: Soft, non-distended.  BS present.  Minimal epigastric TTP. Musculoskeletal: Symmetrical with no gross deformities  Skin: No lesions on visible extremities Extremities: No edema  Neurological: Alert oriented x 4, grossly non-focal Psychological:  Alert and cooperative. Normal mood and affect  ASSESSMENT AND PLAN: *Epigastric pain, GERD:  Hpylori negative.  Symptoms improved, but still with occasional epigastric pain at nighttime.  Still on pantoprazole 40 mg daily.  Will continue for now.  Will plan for EGD with Dr. Silverio Decamp to rule out ulcer, esophagitis, etc.  Also for patient piece of mind.  **The risks, benefits, and alternatives to EGD were discussed with the patient and she consents to proceed.   CC:  Inda Coke, [place]

## 2019-02-07 NOTE — Patient Instructions (Signed)
If you are age 38 or younger, your body mass index should be between 19-25. Your Body mass index is 20.56 kg/m. If this is out of the aformentioned range listed, please consider follow up with your Primary Care Provider.    You have been scheduled for an endoscopy. Please follow written instructions given to you at your visit today. If you use inhalers (even only as needed), please bring them with you on the day of your procedure.  Thank you for choosing me and [facility].  Janett Billow Zehr-PA

## 2019-02-07 NOTE — Progress Notes (Signed)
Reviewed and agree with documentation and assessment and plan. [name] , MD   

## 2019-02-08 ENCOUNTER — Telehealth: Payer: Self-pay

## 2019-02-08 ENCOUNTER — Other Ambulatory Visit: Payer: Self-pay

## 2019-02-08 DIAGNOSIS — Z1159 Encounter for screening for other viral diseases: Secondary | ICD-10-CM

## 2019-02-08 NOTE — Telephone Encounter (Signed)
Pt returned call and answered “No” to all questions.  ° °

## 2019-02-08 NOTE — Telephone Encounter (Signed)
Covid-19 screening questions   Do you now or have you had a fever in the last 14 days?  Do you have any respiratory symptoms of shortness of breath or cough now or in the last 14 days?  Do you have any family members or close contacts with diagnosed or suspected Covid-19 in the past 14 days?  Have you been tested for Covid-19 and found to be positive?       

## 2019-02-09 ENCOUNTER — Ambulatory Visit (AMBULATORY_SURGERY_CENTER): Payer: Managed Care, Other (non HMO) | Admitting: Gastroenterology

## 2019-02-09 ENCOUNTER — Other Ambulatory Visit: Payer: Self-pay

## 2019-02-09 ENCOUNTER — Encounter: Payer: Self-pay | Admitting: Gastroenterology

## 2019-02-09 VITALS — BP 134/96 | HR 65 | Temp 97.8°F | Resp 12 | Ht 64.0 in | Wt 119.0 lb

## 2019-02-09 DIAGNOSIS — K2950 Unspecified chronic gastritis without bleeding: Secondary | ICD-10-CM

## 2019-02-09 DIAGNOSIS — R1013 Epigastric pain: Secondary | ICD-10-CM

## 2019-02-09 MED ORDER — SODIUM CHLORIDE 0.9 % IV SOLN: 500.0000 mL | Freq: Once | INTRAVENOUS | Status: DC

## 2019-02-09 NOTE — Op Note (Signed)
[facility] Patient Name: Jamie [place] Procedure Date: 02/09/2019 1:34 PM MRN: [id number] Endoscopist: Napoleon Form , MD Age: 37 Referring MD:  Date of Birth: 1980-10-02 Gender: Female Account #: [id number] Procedure:                Upper GI endoscopy Indications:              Epigastric abdominal pain Medicines:                Monitored Anesthesia Care Procedure:                Pre-Anesthesia Assessment:                           - Prior to the procedure, a History and Physical                            was performed, and patient medications and                            allergies were reviewed. The patient's tolerance of                            previous anesthesia was also reviewed. The risks                            and benefits of the procedure and the sedation                            options and risks were discussed with the patient.                            All questions were answered, and informed consent                            was obtained. Prior Anticoagulants: The patient has                            taken no previous anticoagulant or antiplatelet                            agents. ASA Grade Assessment: II - A patient with                            mild systemic disease. After reviewing the risks                            and benefits, the patient was deemed in                            satisfactory condition to undergo the procedure.                           After obtaining informed consent, the endoscope was  passed under direct vision. Throughout the                            procedure, the patient's blood pressure, pulse, and                            oxygen saturations were monitored continuously. The                            Endoscope was introduced through the mouth, and                            advanced to the second part of duodenum. The upper                            GI endoscopy was  accomplished without difficulty.                            The patient tolerated the procedure well. Scope In: Scope Out: Findings:                 The esophagus was normal.                           The Z-line was regular and was found 36 cm from the                            incisors.                           Patchy mild inflammation characterized by                            congestion (edema) and erythema was found in the                            entire examined stomach. Biopsies were taken with a                            cold forceps for Helicobacter pylori testing.                           The examined duodenum was normal. Complications:            No immediate complications. Estimated Blood Loss:     Estimated blood loss was minimal. Impression:               - Normal esophagus.                           - Z-line regular, 36 cm from the incisors.                           - Gastritis. Biopsied.                           -  Normal examined duodenum. Recommendation:           - Patient has a contact number available for                            emergencies. The signs and symptoms of potential                            delayed complications were discussed with the                            patient. Return to normal activities tomorrow.                            Written discharge instructions were provided to the                            patient.                           - Resume previous diet.                           - Continue present medications.                           - Await pathology results.                           - Return to GI office in 3 months. Napoleon FormKavitha V. Sweet Jarvis, MD 02/09/2019 1:53:05 PM This report has been signed electronically.

## 2019-02-09 NOTE — Progress Notes (Signed)
Teeth unchanged after procedure. 

## 2019-02-09 NOTE — Progress Notes (Signed)
A and O x3. Report to RN. Tolerated MAC anesthesia well.

## 2019-02-09 NOTE — Progress Notes (Signed)
Called to room to assist during endoscopic procedure.  Patient ID and intended procedure confirmed with present staff. Received instructions for my participation in the procedure from the performing physician.  

## 2019-02-09 NOTE — Patient Instructions (Signed)
Handout on Gastritis given to you today  Await biopsy results from Dr Silverio Decamp   Return to Dr Woodward Ku office in 3 months- make this appointment when convenient    YOU HAD AN ENDOSCOPIC PROCEDURE TODAY AT [facility]:   Refer to the procedure report that was given to you for any specific questions about what was found during the examination.  If the procedure report does not answer your questions, please call your gastroenterologist to clarify.  If you requested that your care partner not be given the details of your procedure findings, then the procedure report has been included in a sealed envelope for you to review at your convenience later.  YOU SHOULD EXPECT: Some feelings of bloating in the abdomen. Passage of more gas than usual.  Walking can help get rid of the air that was put into your GI tract during the procedure and reduce the bloating. If you had a lower endoscopy (such as a colonoscopy or flexible sigmoidoscopy) you may notice spotting of blood in your stool or on the toilet paper. If you underwent a bowel prep for your procedure, you may not have a normal bowel movement for a few days.  Please Note:  You might notice some irritation and congestion in your nose or some drainage.  This is from the oxygen used during your procedure.  There is no need for concern and it should clear up in a day or so.  SYMPTOMS TO REPORT IMMEDIATELY:     Following upper endoscopy (EGD)  Vomiting of blood or coffee ground material  New chest pain or pain under the shoulder blades  Painful or persistently difficult swallowing  New shortness of breath  Fever of 100F or higher  Black, tarry-looking stools  For urgent or emergent issues, a gastroenterologist can be reached at any hour by calling [phone].   DIET:  We do recommend a small meal at first, but then you may proceed to your regular diet.  Drink plenty of fluids but you should avoid alcoholic beverages for  24 hours.  ACTIVITY:  You should plan to take it easy for the rest of today and you should NOT DRIVE or use heavy machinery until tomorrow (because of the sedation medicines used during the test).    FOLLOW UP: Our staff will call the number listed on your records 48-72 hours following your procedure to check on you and address any questions or concerns that you may have regarding the information given to you following your procedure. If we do not reach you, we will leave a message.  We will attempt to reach you two times.  During this call, we will ask if you have developed any symptoms of COVID 19. If you develop any symptoms (ie: fever, flu-like symptoms, shortness of breath, cough etc.) before then, please call [phone].  If you test positive for Covid 19 in the 2 weeks post procedure, please call and report this information to [place].    If any biopsies were taken you will be contacted by phone or by letter within the next 1-3 weeks.  Please call us at [phone] if you have not heard about the biopsies in 3 weeks.    SIGNATURES/CONFIDENTIALITY: You and/or your care partner have signed paperwork which will be entered into your electronic medical record.  These signatures attest to the fact that that the information above on your After Visit Summary has been reviewed and is understood.  Full responsibility of  the confidentiality of this discharge information lies with you and/or your care-partner. 

## 2019-02-09 NOTE — Progress Notes (Signed)
Temp taken by [facility] VS taken by CW 

## 2019-02-13 ENCOUNTER — Telehealth: Payer: Self-pay

## 2019-02-13 NOTE — Telephone Encounter (Signed)
  Follow up Call-  Call back number 02/09/2019  Post procedure Call Back phone  # [phone]  Permission to leave phone message Yes     Patient questions:  Do you have a fever, pain , or abdominal swelling? No. Pain Score  0 *  Have you tolerated food without any problems? Yes.    Have you been able to return to your normal activities? Yes.    Do you have any questions about your discharge instructions: Diet   No. Medications  No. Follow up visit  No.  Do you have questions or concerns about your Care? No.  Actions: * If pain score is 4 or above: No action needed, pain <4.  1. Have you developed a fever since your procedure? no  2.   Have you had an respiratory symptoms (SOB or cough) since your procedure? no  3.   Have you tested positive for COVID 19 since your procedure no  4.   Have you had any family members/close contacts diagnosed with the COVID 19 since your procedure?  no   If yes to any of these questions please route to Joylene John, RN and Alphonsa Gin, Therapist, sports.

## 2019-02-14 ENCOUNTER — Encounter: Payer: Self-pay | Admitting: Gastroenterology

## 2019-02-20 ENCOUNTER — Encounter: Payer: Self-pay | Admitting: Gastroenterology

## 2019-03-05 ENCOUNTER — Other Ambulatory Visit: Payer: Self-pay

## 2019-03-05 DIAGNOSIS — Z20822 Contact with and (suspected) exposure to covid-19: Secondary | ICD-10-CM

## 2019-03-07 LAB — NOVEL CORONAVIRUS, NAA: SARS-CoV-2, NAA: NOT DETECTED

## 2019-03-21 ENCOUNTER — Other Ambulatory Visit: Payer: Self-pay

## 2019-03-21 DIAGNOSIS — Z20822 Contact with and (suspected) exposure to covid-19: Secondary | ICD-10-CM

## 2019-03-23 LAB — NOVEL CORONAVIRUS, NAA: SARS-CoV-2, NAA: NOT DETECTED

## 2019-09-26 IMAGING — CT CT ABD-PELV W/ CM
2 of 4 series · 15 of 46 positions shown, 17 images · IV contrast (ISOVUE)
Comparison: None.

CLINICAL DATA: Abdominal pain and left flank pain.

EXAM:
CT ABDOMEN AND PELVIS WITH CONTRAST
TECHNIQUE: Multidetector CT imaging of the abdomen and pelvis was performed
using the standard protocol following bolus administration of
intravenous contrast.
CONTRAST:  80 mL [id number] IOPAMIDOL ([id number]) INJECTION 61%

[Series 2: axial st · axial · 0.66mm/px · z∈[+1113,+1543]mm · 12 of 96 slices shown, 14 images]
[im 5/96  soft-tissue]
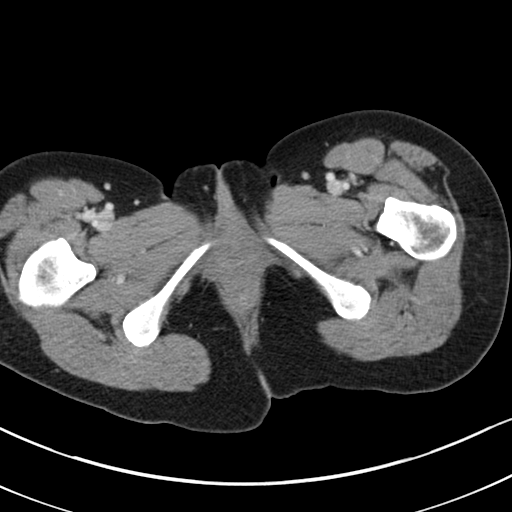
[im 5/96  bone]
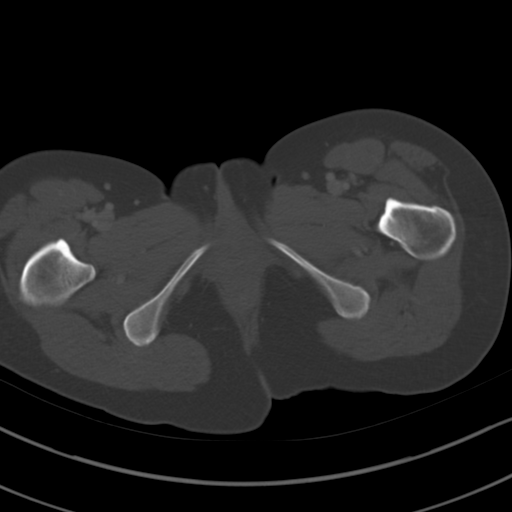
[im 14/96  soft-tissue]
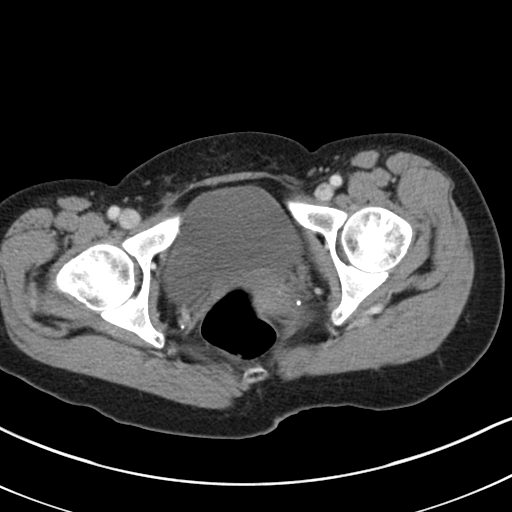
[im 23/96  soft-tissue]
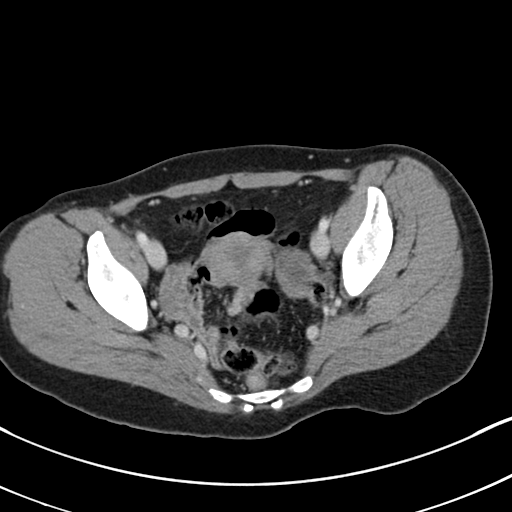
[im 28/96  soft-tissue]
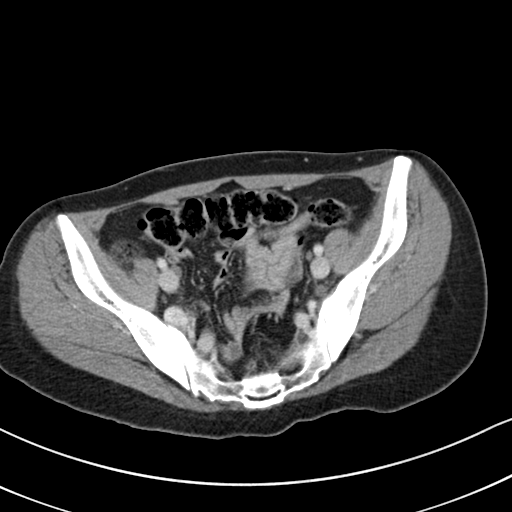
[im 37/96  soft-tissue]
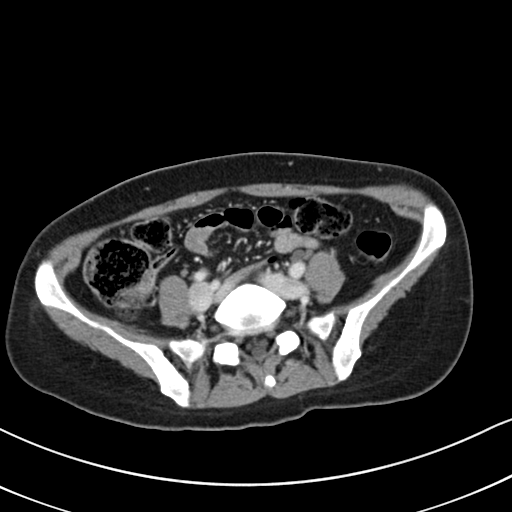
[im 46/96  soft-tissue]
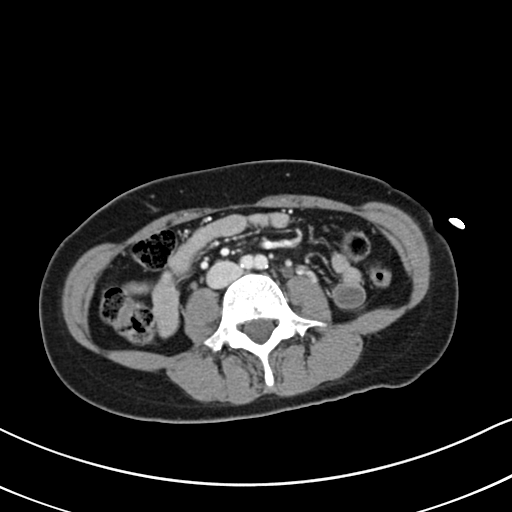
[im 50/96  soft-tissue]
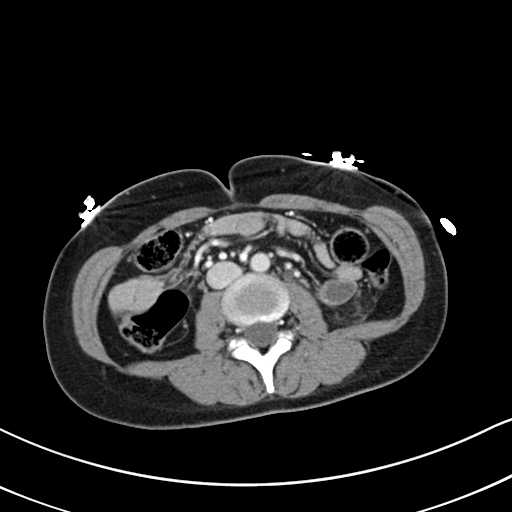
[im 59/96  soft-tissue]
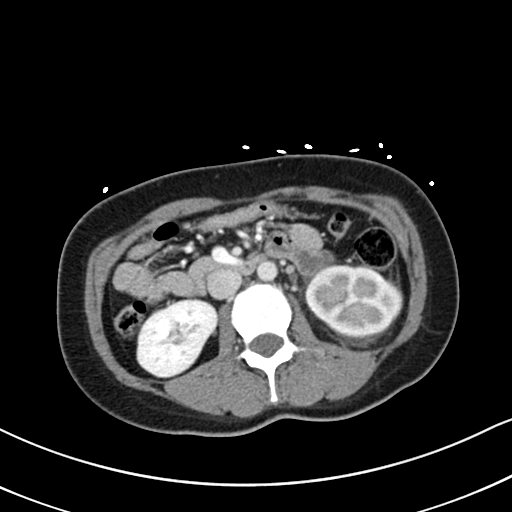
[im 68/96  soft-tissue]
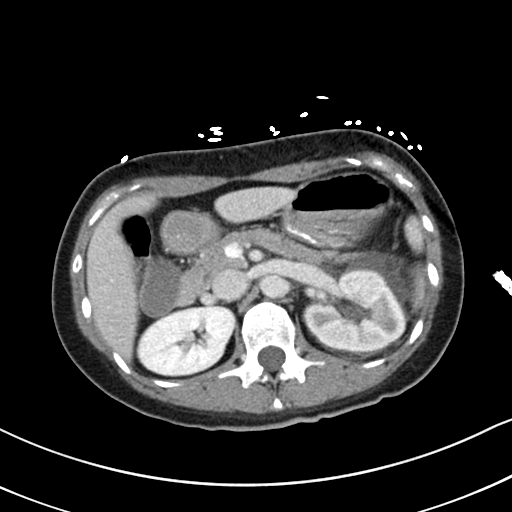
[im 68/96  bone]
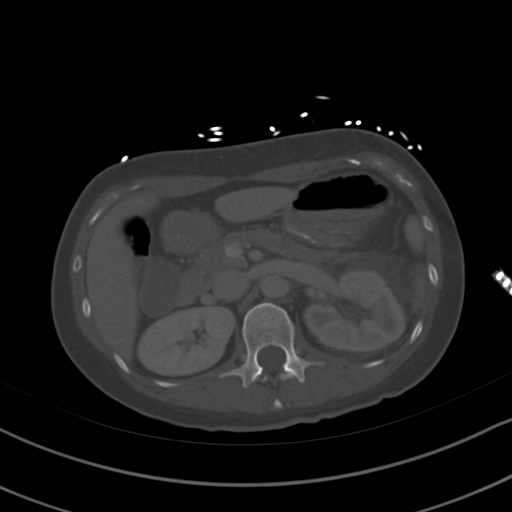
[im 73/96  soft-tissue]
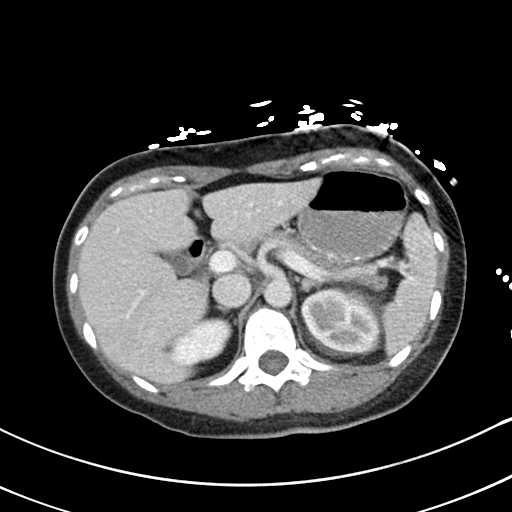
[im 82/96  soft-tissue]
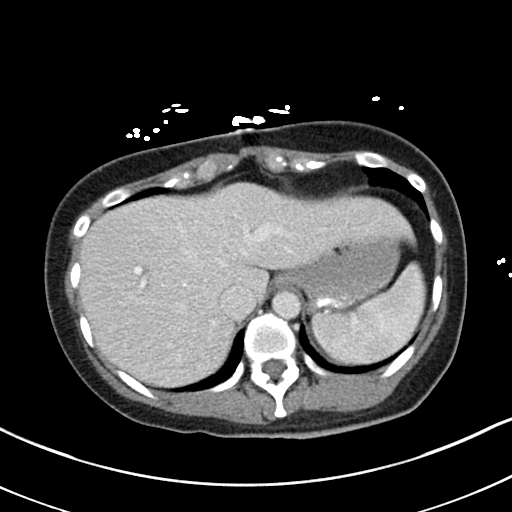
[im 91/96  soft-tissue]
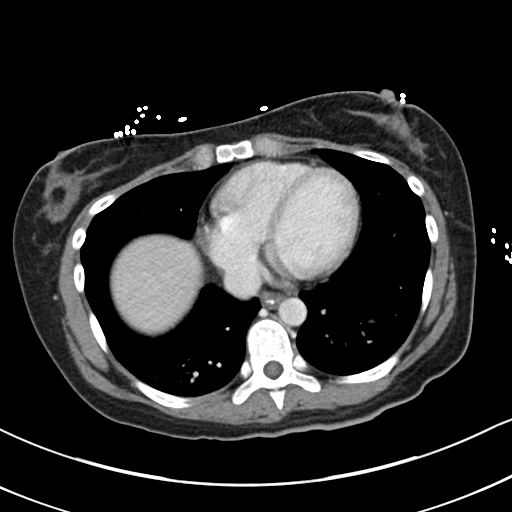

[Series 4: coronal st · coronal · 0.64mm/px · 3 of 64 slices shown]
[im 22/64  soft-tissue]
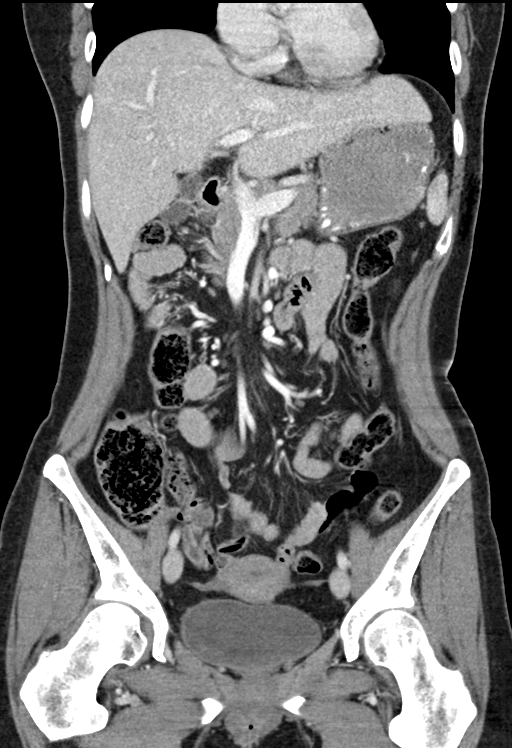
[im 29/64  soft-tissue]
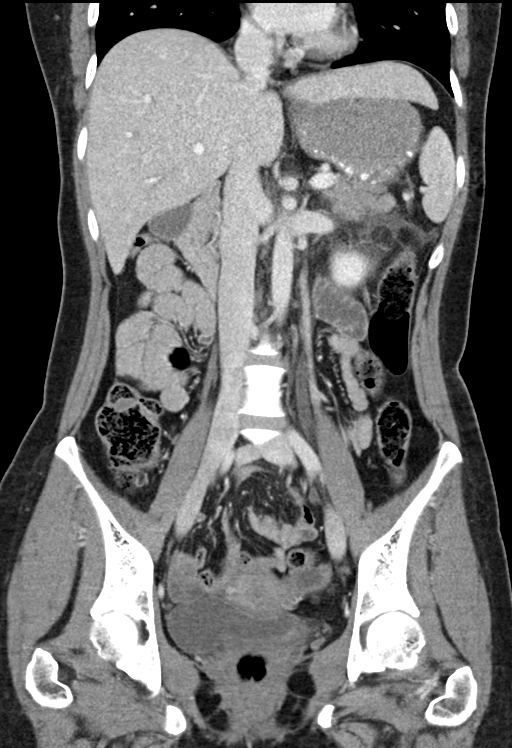
[im 36/64  soft-tissue]
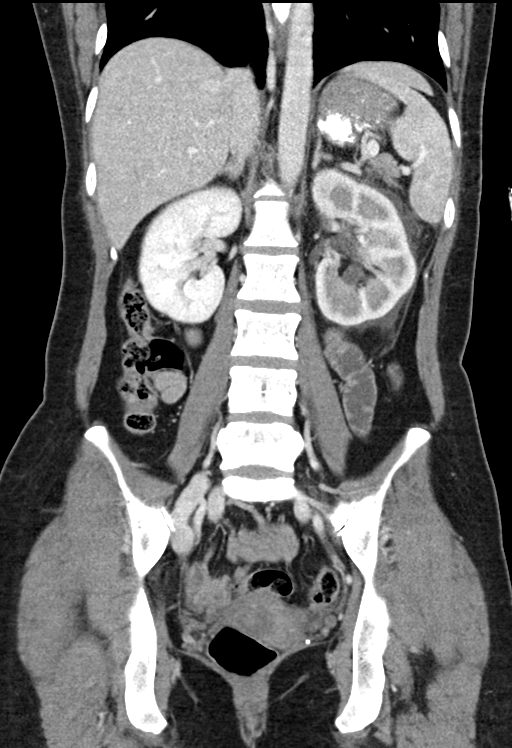

[15 of 46 positions shown; findings below may reference images not displayed]

FINDINGS: Lower chest: No pulmonary nodules or pleural effusion. No visible
pericardial effusion.

Hepatobiliary: Normal hepatic contours and density. No visible
biliary dilatation. Normal gallbladder.

Pancreas: Normal contours without ductal dilatation. No
peripancreatic fluid collection.

Spleen: Normal.

Adrenals/Urinary Tract:

--Adrenal glands: Normal.

--Right kidney/ureter: No hydronephrosis or perinephric stranding.
No nephrolithiasis. No obstructing ureteral stones.

--Left kidney/ureter: There is a 3 x 3 millimeter stone at the left
ureterovesical junction causing mild left hydronephrosis and
moderate hydroureter with moderate perinephric stranding. No other
left nephrolithiasis.

--Urinary bladder: Unremarkable.

Stomach/Bowel:

--Stomach/Duodenum: No hiatal hernia or other gastric abnormality.
Normal duodenal course and caliber.

--Small bowel: No dilatation or inflammation.

--Colon: No focal abnormality.

--Appendix: Normal.

Vascular/Lymphatic: Normal course and caliber of the major abdominal
vessels. No abdominal or pelvic lymphadenopathy.

Reproductive: Normal uterus and ovaries.

Musculoskeletal. No bony spinal canal stenosis or focal osseous
abnormality.

Other: None.
IMPRESSION: 1. Left-sided obstructive uropathy with 3 x 3 millimeter
ureterovesical junction stone causing moderate hydroureter and mild
hydronephrosis with moderate perinephric stranding.
2. No other nephrolithiasis.
3. No diverticulosis or diverticulitis.

## 2021-05-04 IMAGING — US ULTRASOUND ABDOMEN COMPLETE
1 series · 14 of 25 positions shown · non-contrast
Comparison: CT of the abdomen on 05/09/2017

CLINICAL DATA: Epigastric burning pain.

EXAM:
ABDOMEN ULTRASOUND COMPLETE

[Series 1: ultrasound abdomen complete · 14 of 66 slices shown]
[im 1/66]
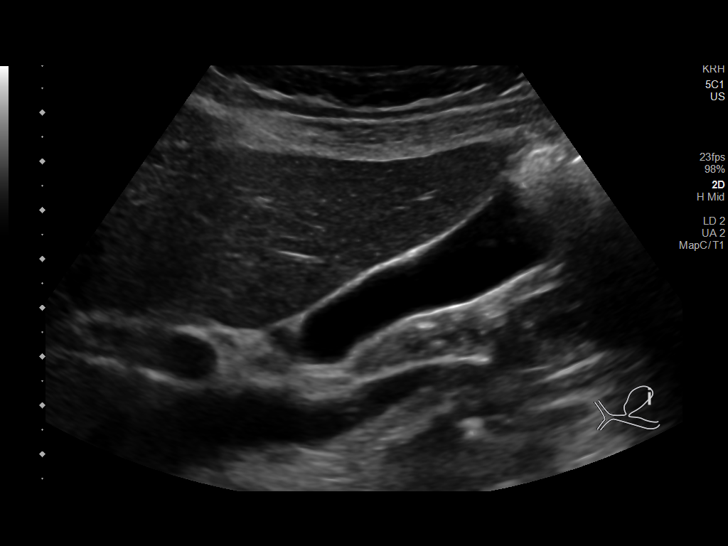
[im 6/66]
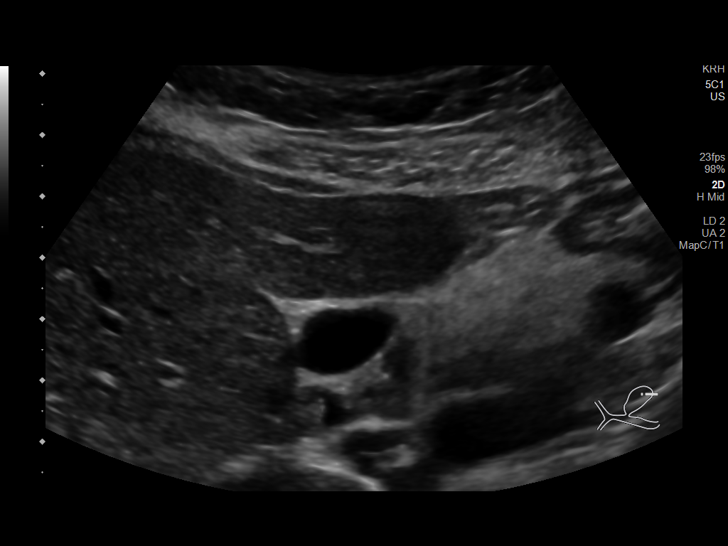
[im 11/66]
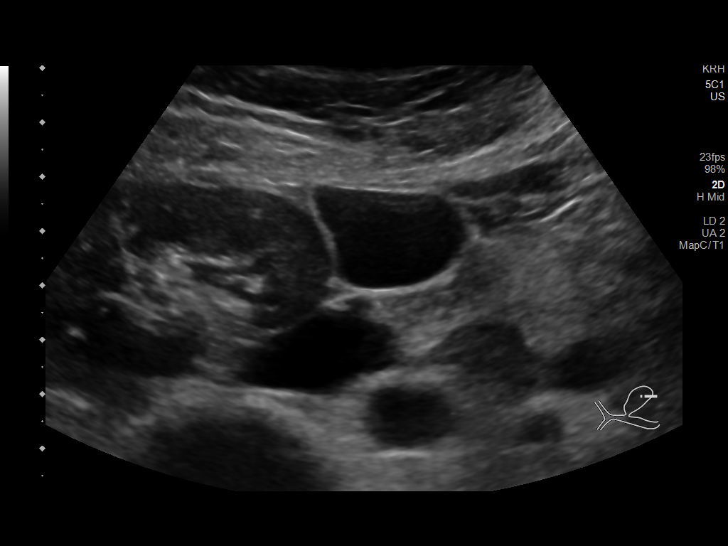
[im 17/66]
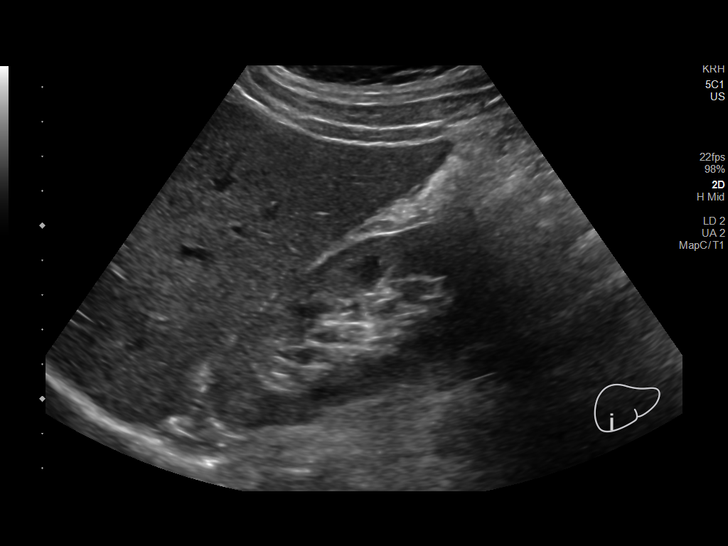
[im 22/66]
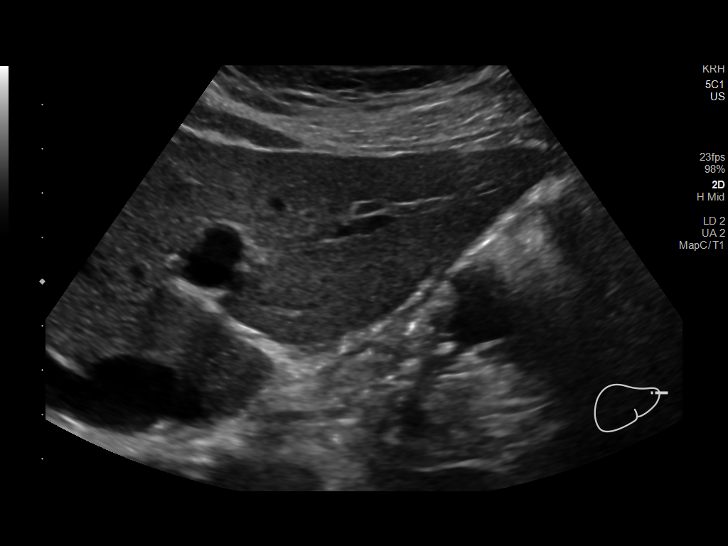
[im 25/66]
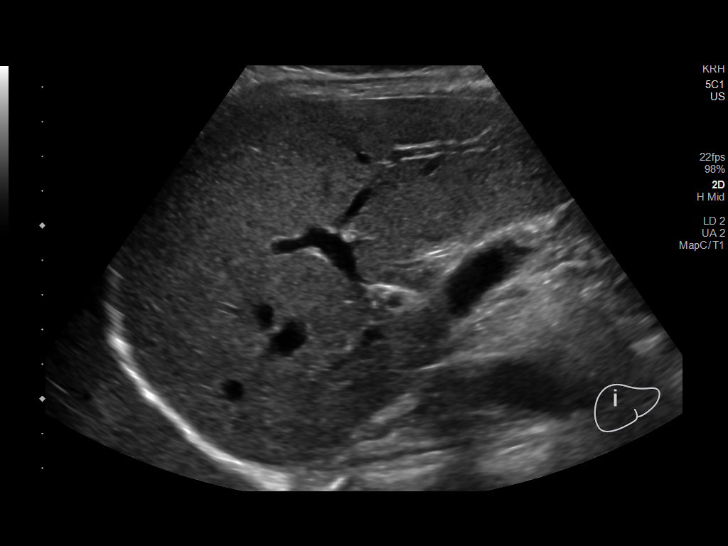
[im 30/66]
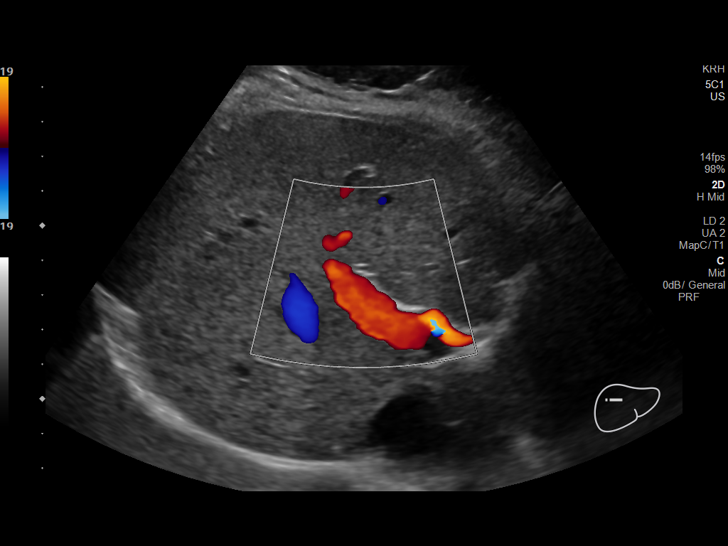
[im 36/66]
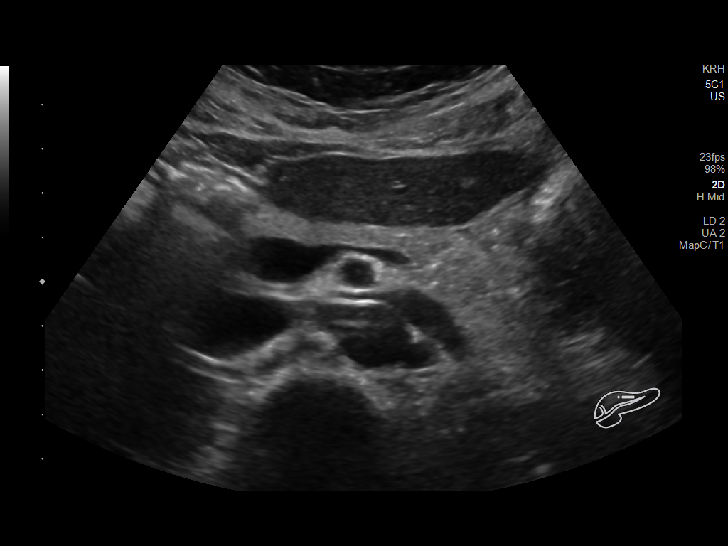
[im 41/66]
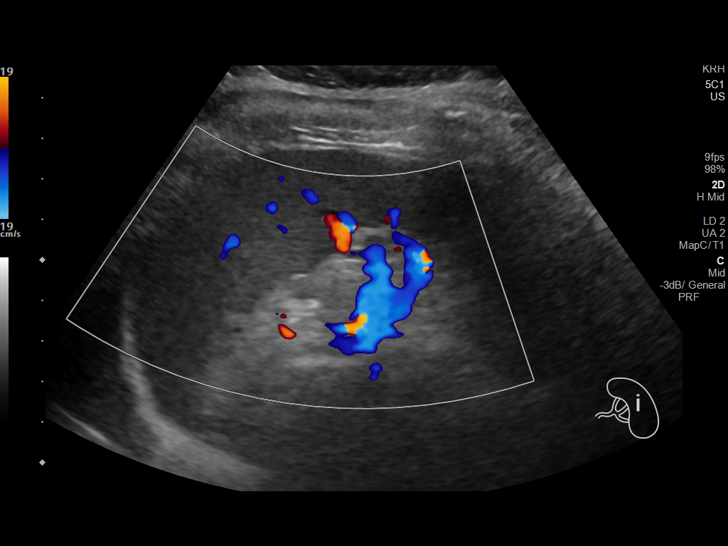
[im 44/66]
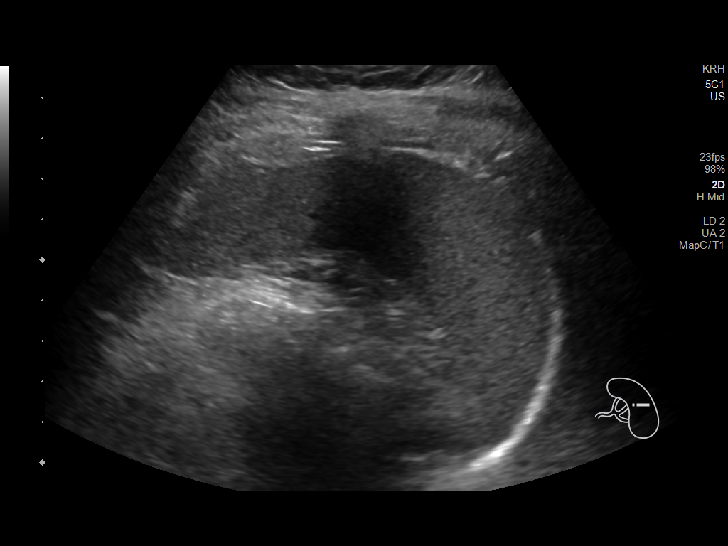
[im 49/66]
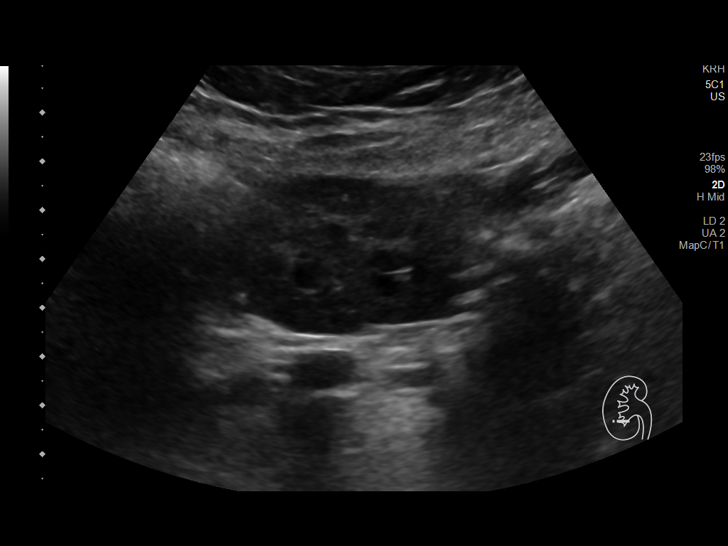
[im 55/66]
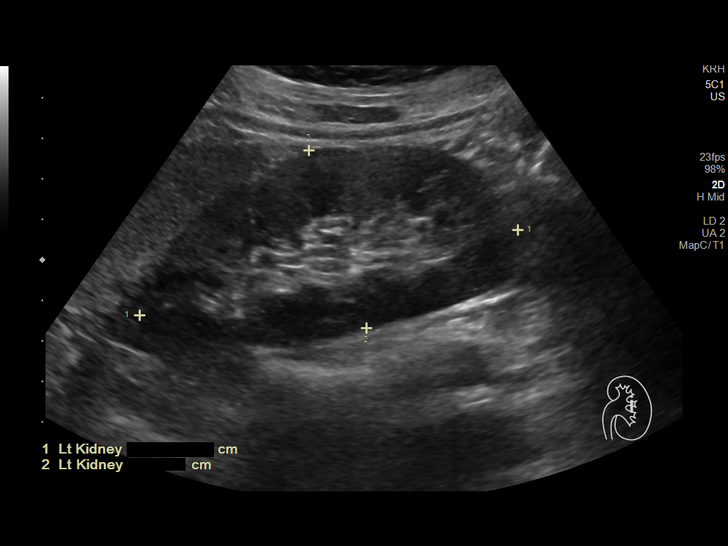
[im 60/66]
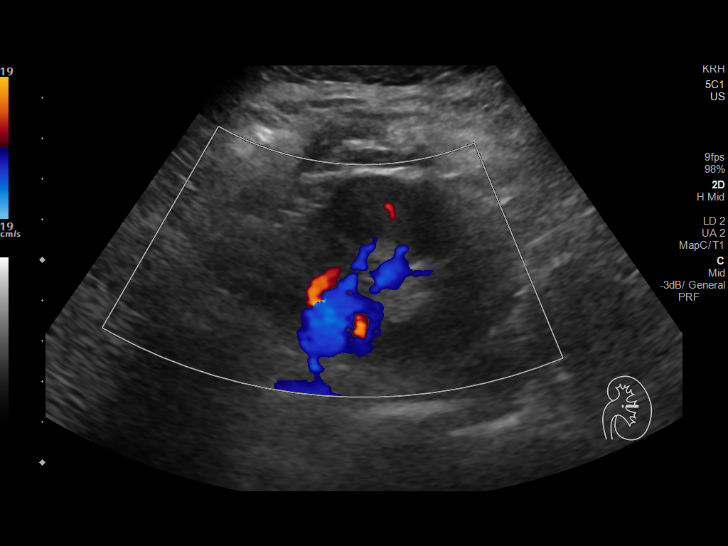
[im 66/66]
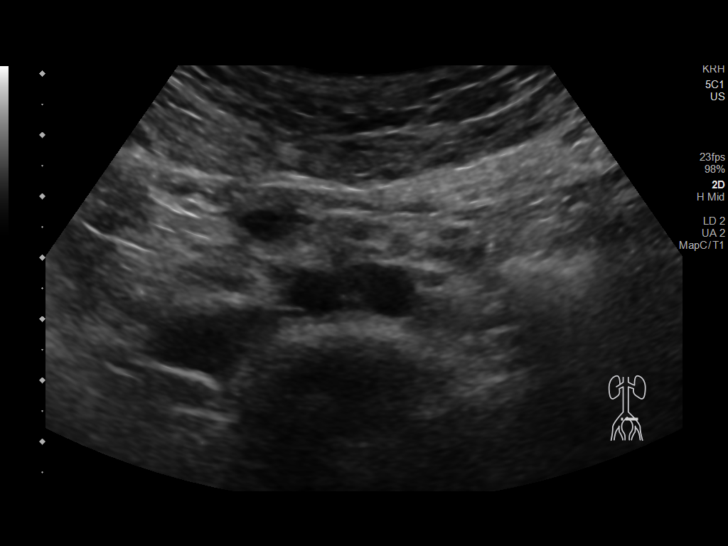

[14 of 25 positions shown; findings below may reference images not displayed]

FINDINGS: Gallbladder: No gallstones or wall thickening visualized. No
sonographic Murphy sign noted by sonographer.

Common bile duct: Diameter: 2 mm.

Liver: No focal lesion identified. Within normal limits in
parenchymal echogenicity. Portal vein is patent on color Doppler
imaging with normal direction of blood flow towards the liver.

IVC: No abnormality visualized.

Pancreas: Visualized portion unremarkable.

Spleen: Size and appearance within normal limits.

Right Kidney: Length: 10 cm. Echogenicity within normal limits. No
mass or hydronephrosis visualized.

Left Kidney: Length: 9.6 cm. Echogenicity within normal limits. No
mass or hydronephrosis visualized.

Abdominal aorta: No aneurysm visualized.

Other findings: No ascites.
IMPRESSION: Normal abdominal ultrasound.
